# Patient Record
Sex: Male | Born: 1995
Health system: Southern US, Community
[De-identification: ages and names within clinical notes are randomized; demographics above are authoritative.]

## PROBLEM LIST (undated history)

## (undated) DIAGNOSIS — E785 Hyperlipidemia, unspecified: Secondary | ICD-10-CM

## (undated) DIAGNOSIS — R519 Headache, unspecified: Secondary | ICD-10-CM

## (undated) DIAGNOSIS — R51 Headache: Secondary | ICD-10-CM

## (undated) HISTORY — DX: Headache: R51

## (undated) HISTORY — PX: NO PAST SURGERIES: SHX2092

## (undated) HISTORY — DX: Headache, unspecified: R51.9

## (undated) HISTORY — DX: Hyperlipidemia, unspecified: E78.5

---
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
## 2018-02-05 ENCOUNTER — Ambulatory Visit: Payer: 59 | Admitting: Family Medicine

## 2018-02-05 ENCOUNTER — Encounter: Payer: Self-pay | Admitting: Family Medicine

## 2018-02-05 VITALS — BP 98/70 | HR 102 | Temp 98.6°F | Ht 70.75 in | Wt 227.0 lb

## 2018-02-05 DIAGNOSIS — Z0000 Encounter for general adult medical examination without abnormal findings: Secondary | ICD-10-CM

## 2018-02-05 DIAGNOSIS — Z23 Encounter for immunization: Secondary | ICD-10-CM

## 2018-02-05 DIAGNOSIS — G44219 Episodic tension-type headache, not intractable: Secondary | ICD-10-CM

## 2018-02-05 DIAGNOSIS — F329 Major depressive disorder, single episode, unspecified: Secondary | ICD-10-CM | POA: Diagnosis not present

## 2018-02-05 DIAGNOSIS — E6609 Other obesity due to excess calories: Secondary | ICD-10-CM | POA: Diagnosis not present

## 2018-02-05 DIAGNOSIS — R4589 Other symptoms and signs involving emotional state: Secondary | ICD-10-CM

## 2018-02-05 DIAGNOSIS — R21 Rash and other nonspecific skin eruption: Secondary | ICD-10-CM | POA: Diagnosis not present

## 2018-02-05 DIAGNOSIS — Z113 Encounter for screening for infections with a predominantly sexual mode of transmission: Secondary | ICD-10-CM

## 2018-02-05 LAB — COMPREHENSIVE METABOLIC PANEL WITH GFR
ALT: 51 U/L (ref 0–53)
AST: 24 U/L (ref 0–37)
Albumin: 4.9 g/dL (ref 3.5–5.2)
Alkaline Phosphatase: 41 U/L (ref 39–117)
BUN: 14 mg/dL (ref 6–23)
CO2: 28 meq/L (ref 19–32)
Calcium: 9.9 mg/dL (ref 8.4–10.5)
Chloride: 104 meq/L (ref 96–112)
Creatinine, Ser: 0.99 mg/dL (ref 0.40–1.50)
GFR: 101.46 mL/min
Glucose, Bld: 90 mg/dL (ref 70–99)
Potassium: 4.6 meq/L (ref 3.5–5.1)
Sodium: 140 meq/L (ref 135–145)
Total Bilirubin: 0.2 mg/dL (ref 0.2–1.2)
Total Protein: 8.3 g/dL (ref 6.0–8.3)

## 2018-02-05 LAB — CBC WITH DIFFERENTIAL/PLATELET
BASOS ABS: 0.1 10*3/uL (ref 0.0–0.1)
Basophils Relative: 0.8 % (ref 0.0–3.0)
Eosinophils Absolute: 0.1 10*3/uL (ref 0.0–0.7)
Eosinophils Relative: 1.2 % (ref 0.0–5.0)
HCT: 43.7 % (ref 39.0–52.0)
Hemoglobin: 14.9 g/dL (ref 13.0–17.0)
LYMPHS ABS: 2.4 10*3/uL (ref 0.7–4.0)
Lymphocytes Relative: 26.7 % (ref 12.0–46.0)
MCHC: 34.2 g/dL (ref 30.0–36.0)
MCV: 85.1 fl (ref 78.0–100.0)
MONO ABS: 0.5 10*3/uL (ref 0.1–1.0)
Monocytes Relative: 6 % (ref 3.0–12.0)
NEUTROS ABS: 5.9 10*3/uL (ref 1.4–7.7)
NEUTROS PCT: 65.3 % (ref 43.0–77.0)
PLATELETS: 310 10*3/uL (ref 150.0–400.0)
RBC: 5.14 Mil/uL (ref 4.22–5.81)
RDW: 13.3 % (ref 11.5–14.6)
WBC: 9 10*3/uL (ref 4.5–10.5)

## 2018-02-05 LAB — LIPID PANEL
Cholesterol: 266 mg/dL — ABNORMAL HIGH (ref 0–200)
HDL: 50.2 mg/dL
NonHDL: 215.79
Total CHOL/HDL Ratio: 5
Triglycerides: 374 mg/dL — ABNORMAL HIGH (ref 0.0–149.0)
VLDL: 74.8 mg/dL — ABNORMAL HIGH (ref 0.0–40.0)

## 2018-02-05 LAB — LDL CHOLESTEROL, DIRECT: LDL DIRECT: 174 mg/dL

## 2018-02-05 LAB — VITAMIN D 25 HYDROXY (VIT D DEFICIENCY, FRACTURES): VITD: 21.53 ng/mL — ABNORMAL LOW (ref 30.00–100.00)

## 2018-02-05 NOTE — Patient Instructions (Signed)

## 2018-02-05 NOTE — Progress Notes (Signed)
Subjective:    Patient ID: Nathan Bishop, male    DOB: 1996-04-17, 21 y.o.   MRN: [id number]  HPI This is a 22 yo male who presents today to establish care. He is accompanied by a friend who was not present for the entire interview. He has recently finished [employer] and will be going to [employer] for music education. He plays the double bass. Hangs out with friends.   No hospitalizations, no regular medications. His mother wanted him to have a check up.   Headaches- weekly, pain across front of head, pounding, light sensitivity, no sound sensitivity, no aura, no nausea/vomiting. Relief with ibuprofen, sleep. Caffeine- 3-4 servings daily. Seems to be triggered by inadequate sleep and stress.   Rash- has intermittent dryness of face, resolved with rare use of otc hydrocortisone cream.      Last CPE- 4 years ago Tdap/Td- due 8/19, will have today Flu- never Dental- regular Exercise- walking Diet- working on eating healthier Sex- sexually active with one male partner; she has had additional partners, he has never had STD testing, will have today    Past Medical History:  Diagnosis Date  . Frequent headaches   . Hyperlipidemia   History reviewed. No pertinent surgical history. Family History  Problem Relation Age of Onset  . Arthritis Mother   . Cancer Mother   . Hyperlipidemia Mother   . Misc[place]riages [place]   . Arthritis Father   . Heart disease Father   . Hyperlipidemia Father   . Arthritis Maternal Grandmother   . Arthritis Maternal Grandfather   . Hypertension Maternal Grandfather   . Heart disease Maternal Grandfather   . Hyperlipidemia Maternal Grandfather   . Hypertension Paternal Grandfather   . Heart attack Paternal Grandfather    Social History   Tobacco Use  . Smoking status: Never Smoker  . Smokeless tobacco: Never Used  Substance Use Topics  . Alcohol use: Not Currently  . Drug use: Never       Review of Systems  Constitutional:  Positive for fatigue. Negative for fever and unexpected weight change.  HENT: Negative.   Eyes: Negative.   Respiratory: Negative.   Cardiovascular: Negative.   Endocrine: Negative.   Genitourinary: Negative.   Musculoskeletal: Negative.   Skin:       Dry areas on face.   Allergic/Immunologic: Negative.   Neurological: Negative.   Hematological: Negative.   Psychiatric/Behavioral: Positive for dysphoric mood, sleep disturbance (irregular sleep) and suicidal ideas (not recent, never attempted, no plan, no firearms in home). Negative for self-injury.       Objective:   Physical Exam Physical Exam  Constitutional: He is oriented to person, place, and time. He appears well-developed and well-nourished.  HENT:  Head: Normocephalic and atraumatic.  Right Ear: External ear normal.  Left Ear: External ear normal.  Nose: Nose normal.  Mouth/Throat: Oropharynx is clear and moist.  Eyes: Conjunctivae are normal. Pupils are equal, round, and reactive to light.  Neck: Normal range of motion. Neck supple.  Cardiovascular: Normal rate, regular rhythm, normal heart sounds and intact distal pulses.   Pulmonary/Chest: Effort normal and breath sounds normal.  Abdominal: Soft. Bowel sounds are normal. Hernia confirmed negative in the right inguinal area and confirmed negative in the left inguinal area.  Genitourinary: Testes normal and penis normal. Circumcised.  Musculoskeletal: Normal range of motion. He exhibits no edema or tenderness.       Cervical back: Normal.       Thoracic  back: Normal.       Lumbar back: Normal.  Lymphadenopathy:    He has no cervical adenopathy.       Right: No inguinal adenopathy present.       Left: No inguinal adenopathy present.  Neurological: He is alert and oriented to person, place, and time. He has normal reflexes.  Skin: Skin is warm and dry.  Psychiatric: He has a normal mood and affect. His behavior is normal. Judgment normal.  Vitals  reviewed.     BP 98/70   Pulse (!) 102   Temp 98.6 F (37 C)   Wt 227 lb (103 kg)   SpO2 98%   Depression screen PHQ 2/9 02/05/2018  Decreased Interest 2  Down, Depressed, Hopeless 2  PHQ - 2 Score 4  Altered sleeping 2  Tired, decreased energy 2  Change in appetite 1  Feeling bad or failure about yourself  2  Trouble concentrating 1  Moving slowly or fidgety/restless 1  Suicidal thoughts 1  PHQ-9 Score 14       Assessment & Plan:  1. Annual physical exam - will request records from previous pcp  2. Routine screening for STI (sexually transmitted infection) - RPR - HIV antibody - C. TRACHOMATIS/N. GONORRHOEAE RNA ([facility])  3. Rash of face - discussed limited use of otc steroid cream, daily sunscreen - Comprehensive metabolic panel - Vitamin D, 25-hydroxy - CBC with Differential  4. Episodic tension-type headache, not intractable - seems triggered by fatigue, stress, discussed sleep hygiene, hydration, stress management  5. Obesity due to excess calories without serious comorbidity, unspecified classification - encouraged his efforts to make healthy food choices, exercise regularly - Lipid Panel - Comprehensive metabolic panel - Vitamin D, 25-hydroxy  6. Need for tetanus booster - Td : Tetanus/diphtheria >7yo Preservative  free  7. Depressed mood - discussed therapy, medication, lifestyle modifications to improve sleep and mood - discussed available resources, RTC/ER precautions  - RTC 1 year, sooner if needed  Olean Ree, FNP-BC  [facility], [facility]  02/11/2018 12:08 PM

## 2018-02-08 LAB — C. TRACHOMATIS/N. GONORRHOEAE RNA
C. TRACHOMATIS RNA, TMA: NOT DETECTED
N. gonorrhoeae RNA, TMA: NOT DETECTED

## 2018-02-08 LAB — HIV ANTIBODY (ROUTINE TESTING W REFLEX): HIV 1&2 Ab, 4th Generation: NONREACTIVE

## 2018-02-08 LAB — RPR: RPR Ser Ql: NONREACTIVE

## 2018-02-09 ENCOUNTER — Telehealth: Payer: Self-pay | Admitting: Family Medicine

## 2018-02-09 NOTE — Telephone Encounter (Signed)
Copied from CRM [id number]. Topic: Quick Communication - Lab Results >> Feb 09, 2018 10:04 AM Lutricia Horsfallondon, [place] A, [place] wrote: Called patient to inform them of  lab results. When patient returns call, triage nurse may disclose results. >> Feb 09, 2018 12:41 PM Gaynelle AduPoole, Shalonda wrote: Pt is requesting a call back after 2pm

## 2018-02-10 NOTE — Telephone Encounter (Signed)
Left message on voicemail for pt to return call to office for results.    

## 2018-02-11 ENCOUNTER — Encounter: Payer: Self-pay | Admitting: Family Medicine

## 2018-08-04 ENCOUNTER — Encounter: Payer: Self-pay | Admitting: Family Medicine

## 2018-08-04 ENCOUNTER — Ambulatory Visit: Payer: 59 | Admitting: Family Medicine

## 2018-08-04 VITALS — BP 138/98 | HR 82 | Temp 98.3°F | Ht 70.75 in | Wt 237.0 lb

## 2018-08-04 DIAGNOSIS — F329 Major depressive disorder, single episode, unspecified: Secondary | ICD-10-CM

## 2018-08-04 DIAGNOSIS — F419 Anxiety disorder, unspecified: Secondary | ICD-10-CM | POA: Diagnosis not present

## 2018-08-04 DIAGNOSIS — B070 Plantar wart: Secondary | ICD-10-CM

## 2018-08-04 DIAGNOSIS — F32A Depression, unspecified: Secondary | ICD-10-CM

## 2018-08-04 MED ORDER — SERTRALINE HCL 50 MG PO TABS: 50.0000 mg | ORAL_TABLET | Freq: Every day | ORAL | 3 refills | Status: DC

## 2018-08-04 NOTE — Patient Instructions (Addendum)
Good to see you today  Please follow up in 6-8 weeks to see how you are doing on your medication. Follow up sooner if worsening symptoms or medication side effects.   Things that help in addition to medication- balanced diet, regular exercise, regular sleep (not too much or too little), avoid alcohol, mindfulness/meditation/yoga  For plantar wart (see information included)- gently remove top of callous and apply over the counter salicylic wart remover, cover with duct tape or band aid, reapply when tape falls off, no more than once a day  Mindfulness-Based Stress Reduction Mindfulness-based stress reduction (MBSR) is a program that helps people learn to practice mindfulness. Mindfulness is the practice of intentionally paying attention to the present moment. It can be learned and practiced through techniques such as education, breathing exercises, meditation, and yoga. MBSR includes several mindfulness techniques in one program. MBSR works best when you understand the treatment, are willing to try new things, and can commit to spending time practicing what you learn. MBSR training may include learning about:  How your emotions, thoughts, and reactions affect your body.  New ways to respond to things that cause negative thoughts to start (triggers).  How to notice your thoughts and let go of them.  Practicing awareness of everyday things that you normally do without thinking.  The techniques and goals of different types of meditation.  What are the benefits of MBSR? MBSR can have many benefits, which include helping you to:  Develop self-awareness. This refers to knowing and understanding yourself.  Learn skills and attitudes that help you to participate in your own health care.  Learn new ways to care for yourself.  Be more accepting about how things are, and let things go.  Be less judgmental and approach things with an open mind.  Be patient with yourself and trust yourself  more.  MBSR has also been shown to:  Reduce negative emotions, such as depression and anxiety.  Improve memory and focus.  Change how you sense and approach pain.  Boost your body's ability to fight infections.  Help you connect better with other people.  Improve your sense of well-being.  Follow these instructions at home:  Find a local in-person or online MBSR program.  Set aside some time regularly for mindfulness practice.  Find a mindfulness practice that works best for you. This may include one or more of the following: ? Meditation. Meditation involves focusing your mind on a certain thought or activity. ? Breathing awareness exercises. These help you to stay present by focusing on your breath. ? Body scan. For this practice, you lie down and pay attention to each part of your body from head to toe. You can identify tension and soreness and intentionally relax parts of your body. ? Yoga. Yoga involves stretching and breathing, and it can improve your ability to move and be flexible. It can also provide an experience of testing your body's limits, which can help you release stress. ? Mindful eating. This way of eating involves focusing on the taste, texture, color, and smell of each bite of food. Because this slows down eating and helps you feel full sooner, it can be an important part of a weight-loss plan.  Find a podcast or recording that provides guidance for breathing awareness, body scan, or meditation exercises. You can listen to these any time when you have a free moment to rest without distractions.  Follow your treatment plan as told by your health care provider. This may include  taking regular medicines and making changes to your diet or lifestyle as recommended. How to practice mindfulness To do a basic awareness exercise:  Find a comfortable place to sit.  Pay attention to the present moment. Observe your thoughts, feelings, and surroundings just as they  are.  Avoid placing judgment on yourself, your feelings, or your surroundings. Make note of any judgment that comes up, and let it go.  Your mind may wander, and that is okay. Make note of when your thoughts drift, and return your attention to the present moment.  To do basic mindfulness meditation:  Find a comfortable place to sit. This may include a stable chair or a firm floor cushion. ? Sit upright with your back straight. Let your arms fall next to your side with your hands resting on your legs. ? If sitting in a chair, rest your feet flat on the floor. ? If sitting on a cushion, cross your legs in front of you.  Keep your head in a neutral position with your chin dropped slightly. Relax your jaw and rest the tip of your tongue on the roof of your mouth. Drop your gaze to the floor. You can close your eyes if you like.  Breathe normally and pay attention to your breath. Feel the air moving in and out of your nose. Feel your belly expanding and relaxing with each breath.  Your mind may wander, and that is okay. Make note of when your thoughts drift, and return your attention to your breath.  Avoid placing judgment on yourself, your feelings, or your surroundings. Make note of any judgment or feelings that come up, let them go, and bring your attention back to your breath.  When you are ready, lift your gaze or open your eyes. Pay attention to how your body feels after the meditation.  Where to find more information: You can find more information about MBSR from:  Your health care provider.  Community-based meditation centers or programs.  Programs offered near you.  Summary  Mindfulness-based stress reduction (MBSR) is a program that teaches you how to intentionally pay attention to the present moment. It is used with other treatments to help you cope better with daily stress, emotions, and pain.  MBSR focuses on developing self-awareness, which allows you to respond to life  stress without judgment or negative emotions.  MBSR programs may involve learning different mindfulness practices, such as breathing exercises, meditation, yoga, body scan, or mindful eating. Find a mindfulness practice that works best for you, and set aside time for it on a regular basis. This information is not intended to replace advice given to you by your health care provider. Make sure you discuss any questions you have with your health care provider. Document Released: 12/18/2016 Document Revised: 12/18/2016 Document Reviewed: 12/18/2016 Elsevier Interactive Patient Education  2018 Elsevier Inc.  Plantar Warts Plantar warts are small growths on the bottom of the foot (sole). Warts are caused by a type of germ (virus). Most warts are not painful, and they usually do not cause problems. Sometimes, plantar warts can cause pain when you walk. Warts often go away on their own in time. Treatments may be done if needed. Follow these instructions at home: General instructions  Apply creams or solutions only as told by your doctor. Follow these steps if your doctor tells you to do so: ? Soak your foot in warm water. ? Remove the top layer of softened skin before you apply the medicine. You can  use a pumice stone to remove the tissue. ? After you apply the medicine, put a bandage over the area of the wart. ? Repeat the process every day or as told by your doctor.  Do not scratch or pick at a wart.  Wash your hands after you touch a wart.  If a wart is painful, try putting a bandage with a hole in the middle over the wart.  Keep all follow-up visits as told by your doctor. This is important. Prevention  Wear shoes and socks. Change socks every day.  Keep your feet clean and dry.  Check your feet often.  Avoid direct contact with warts on other people. Contact a doctor if:  Your warts do not improve after treatment.  You have redness, swelling, or pain at the site of a wart.  You  have bleeding from a wart, and the bleeding does not stop when you put light pressure on the wart.  You have diabetes and you get a wart. This information is not intended to replace advice given to you by your health care provider. Make sure you discuss any questions you have with your health care provider. Document Released: 09/13/2010 Document Revised: 01/17/2016 Document Reviewed: 11/06/2014 Elsevier Interactive Patient Education  [employer]2018 Elsevier Inc.

## 2018-08-04 NOTE — Progress Notes (Signed)
Subjective:    Patient ID: Nathan Bishop, male    DOB: [birth year], 22 y.o.   MRN: [id number]  HPI This is a 22 yo male who presents today with increasing depression and anxiety. This has been an on and off problem for at least 2 years. Got worse over last 2 months, increased overthinking, panic attacks. Has been seeing a counselor for about a year. He is going to take the next semester off. Will be working and playing more music. Is getting a psychological withdrawal so his grades won't be affected.  Concerned for OCD- repetative checking for locked doors, doesn't like radio station number to be divisible by 5. Some difficulty going to sleep, if he awakens, has difficulty going back to sleep. Sleeping is off schedule, sometimes sleeps too much, other times 5 hours.  Doing some hiking. Occasional panic attacks, hyperventilate, chest pressure, sweating. Occurring less frequently since out of school, max 4-5 times a week. Will be seeing counselor weekly since he is home. Several weeks ago had thoughts of suicide, no plan, never has had a plan or attempted suicide. No recent thoughts of suicide. Living with his parents. Firearms in home, they are locked up.  Mother has anxiety, does not believe in medication but thinks he should try.  Migraines stable. Relieved with ibuprofen.  Has had callous on bottom of right foot for several months. Has not tried any treatment.     Past Medical History:  Diagnosis Date  . Frequent headaches   . Hyperlipidemia    No past surgical history on file. Family History  Problem Relation Age of Onset  . Arthritis Mother   . Cancer Mother   . Hyperlipidemia Mother   . Miscarriages / [place] Mother   . Arthritis Father   . Heart disease Father   . Hyperlipidemia Father   . Arthritis Maternal Grandmother   . Arthritis Maternal Grandfather   . Hypertension Maternal Grandfather   . Heart disease Maternal Grandfather   . Hyperlipidemia Maternal Grandfather   .  Hypertension Paternal Grandfather   . Heart attack Paternal Grandfather    Social History   Tobacco Use  . Smoking status: Never Smoker  . Smokeless tobacco: Never Used  Substance Use Topics  . Alcohol use: Not Currently  . Drug use: Never      Review of Systems Per HPI    Objective:   Physical Exam Physical Exam  Constitutional: Oriented to person, place, and time. He appears well-developed and well-nourished.  HENT:  Head: Normocephalic and atraumatic.  Eyes: Conjunctivae are normal.  Neck: Normal range of motion. Neck supple.  Cardiovascular: Normal rate, regular rhythm and normal heart sounds.   Pulmonary/Chest: Effort normal and breath sounds normal.  Musculoskeletal: Normal range of motion.  Neurological: Alert and oriented to person, place, and time.  Skin: Skin is warm and dry. Bottom of left foot with 1.5 cm area callous, TTP, few black dots.  Psychiatric: Normal mood and affect. Behavior is normal. Judgment and thought content normal.  Vitals reviewed.     BP (!) 138/98   Pulse 82   Temp 98.3 F (36.8 C) (Oral)   Ht 5' 10.75" (1.797 m)   Wt 237 lb (107.5 kg)   SpO2 98%   BMI 33.29 kg/m  Wt Readings from Last 3 Encounters:  08/04/18 237 lb (107.5 kg)  02/05/18 227 lb (103 kg)       Assessment & Plan:  1. Anxiety and depression - continue counseling -  discussed medication including potential for side effects and increased suicide risk in young adults. He is to seek immediate help if any thoughts of suicide.  - discussed importance of exercise, regular sleep, healthy food choices - follow up in 6-8 weeks, sooner if medication side effects or worsening sympotoms - sertraline (ZOLOFT) 50 MG tablet; Take 1 tablet (50 mg total) by mouth at bedtime.  Dispense: 30 tablet; Refill: 3  2. Plantar wart - Provided written and verbal information regarding diagnosis and treatment. - will recheck at follow up, if no improvement, refer to podiatry  Over 30  minutes were spent face-to-face with the patient during this encounter and >50% of that time was spent on counseling and coordination of care    Olean Reeeborah Jahmeir Geisen, FNP-BC  [facility], [facility]  08/04/2018 12:32 PM

## 2018-09-07 ENCOUNTER — Ambulatory Visit: Payer: 59 | Admitting: Family Medicine

## 2018-09-07 ENCOUNTER — Encounter: Payer: Self-pay | Admitting: Family Medicine

## 2018-09-07 VITALS — BP 118/86 | HR 86 | Temp 97.7°F | Ht 70.75 in | Wt 234.8 lb

## 2018-09-07 DIAGNOSIS — J00 Acute nasopharyngitis [common cold]: Secondary | ICD-10-CM

## 2018-09-07 NOTE — Progress Notes (Signed)
   Subjective:     Nathan Bishop is a 23 y.o. male presenting for URI (Symptoms have been off and on since November.  Sinus pressure, nasal congestion, ears feel full, sometimes productive cough, drainage, a little bit of sore throat, some headaches. No fever. Has been taking Advil cold and sinus.)     URI   This is a new problem. The current episode started in the past 7 days. The problem has been gradually worsening. There has been no fever. Associated symptoms include congestion, coughing, headaches, a plugged ear sensation, rhinorrhea and sinus pain. Pertinent negatives include no diarrhea, nausea, sore throat, swollen glands or vomiting. He has tried decongestant and NSAIDs for the symptoms. The treatment provided mild relief.     Review of Systems  HENT: Positive for congestion, rhinorrhea and sinus pain. Negative for sore throat.   Respiratory: Positive for cough.   Gastrointestinal: Negative for diarrhea, nausea and vomiting.  Neurological: Positive for headaches.     Social History   Tobacco Use  Smoking Status Never Smoker  Smokeless Tobacco Never Used        Objective:    BP Readings from Last 3 Encounters:  09/07/18 118/86  08/04/18 (!) 138/98  02/05/18 98/70   Wt Readings from Last 3 Encounters:  09/07/18 234 lb 12 oz (106.5 kg)  08/04/18 237 lb (107.5 kg)  02/05/18 227 lb (103 kg)    BP 118/86   Pulse 86   Temp 97.7 F (36.5 C)   Ht 5' 10.75" (1.797 m)   Wt 234 lb 12 oz (106.5 kg)   SpO2 98%   BMI 32.97 kg/m    Physical Exam Constitutional:      General: He is not in acute distress.    Appearance: He is well-developed. He is not ill-appearing.  HENT:     Head: Normocephalic and atraumatic.     Right Ear: Tympanic membrane and ear canal normal.     Left Ear: Tympanic membrane and ear canal normal.     Nose: Mucosal edema and rhinorrhea present.     Right Sinus: No maxillary sinus tenderness or frontal sinus tenderness.     Left Sinus: No  maxillary sinus tenderness or frontal sinus tenderness.     Mouth/Throat:     Pharynx: Uvula midline. Posterior oropharyngeal erythema present. No oropharyngeal exudate.     Tonsils: Swelling: 0 on the right. 0 on the left.  Neck:     Musculoskeletal: Neck supple.  Cardiovascular:     Rate and Rhythm: Normal rate and regular rhythm.     Heart sounds: No murmur.  Pulmonary:     Effort: Pulmonary effort is normal. No respiratory distress.     Breath sounds: Normal breath sounds.  Lymphadenopathy:     Cervical: No cervical adenopathy.  Skin:    General: Skin is warm and dry.     Capillary Refill: Capillary refill takes less than 2 seconds.  Neurological:     Mental Status: He is alert.           Assessment & Plan:   Problem List Items Addressed This Visit    None    Visit Diagnoses    Acute nasopharyngitis    -  Primary     Symptomatic care  Call back if not improved and still with facial pressure in 1 week and will do round of Abx.   Return if symptoms worsen or fail to improve.  Lynnda ChildJessica R Cody, MD

## 2018-09-07 NOTE — Patient Instructions (Addendum)
Based on your symptoms, it looks like you have a virus.   Antibiotics are not need for a viral infection but the following will help:   1. Drink plenty of fluids 2. Get lots of rest  Sinus Congestion 1) Neti Pot (Saline rinse) -- 2 times day -- if tolerated 2) Flonase (Store Brand ok) - once daily 3) Over the counter congestion medications - Pseudoephedrine   Cough 1) Cough drops can be helpful 2) Nyquil (or nighttime cough medication) 3) Honey is proven to be one of the best cough medications   Sore Throat 1) Honey as above, cough drops 2) Ibuprofen or Aleve can be helpful 3) Salt water Gargles  If you develop fevers (Temperature >100.4), chills, worsening symptoms or symptoms lasting longer than 10 days return to clinic.  --- Call the clinic if still with severe sinus pressure and pain and I will send in Antibiotics

## 2018-10-22 ENCOUNTER — Encounter: Payer: Self-pay | Admitting: Family Medicine

## 2018-10-22 ENCOUNTER — Ambulatory Visit: Payer: 59 | Admitting: Family Medicine

## 2018-10-22 VITALS — BP 124/78 | HR 90 | Temp 97.7°F | Resp 16 | Ht 70.75 in | Wt 235.4 lb

## 2018-10-22 DIAGNOSIS — F419 Anxiety disorder, unspecified: Secondary | ICD-10-CM | POA: Diagnosis not present

## 2018-10-22 DIAGNOSIS — B070 Plantar wart: Secondary | ICD-10-CM | POA: Diagnosis not present

## 2018-10-22 DIAGNOSIS — F329 Major depressive disorder, single episode, unspecified: Secondary | ICD-10-CM | POA: Diagnosis not present

## 2018-10-22 NOTE — Progress Notes (Signed)
Subjective:    Patient ID: Nathan Bishop, male    DOB: 1995-10-06, 23 y.o.   MRN: [id number]  HPI This is a 23 yo male who presents today for follow up of anxiety/depression. Was seen 08/04/18 and started on sertraline. He denies any side effects. He reports symptoms have improved significantly. He continues to go to counseling. He wishes to stay at current dose.  His girlfriend is supportive. He is taking off a semester and has been working part time and playing more music.   Plantar wart- he used otc treatments- liquid and freezing with no relief, has used pumice stone to decrease callous. Painful. He is on his feet a lot and it bothers him.   Past Medical History:  Diagnosis Date  . Frequent headaches   . Hyperlipidemia    No past surgical history on file. Family History  Problem Relation Age of Onset  . Arthritis Mother   . Cancer Mother   . Hyperlipidemia Mother   . Miscarriages / [place] Mother   . Arthritis Father   . Heart disease Father   . Hyperlipidemia Father   . Arthritis Maternal Grandmother   . Arthritis Maternal Grandfather   . Hypertension Maternal Grandfather   . Heart disease Maternal Grandfather   . Hyperlipidemia Maternal Grandfather   . Hypertension Paternal Grandfather   . Heart attack Paternal Grandfather    Social History   Tobacco Use  . Smoking status: Never Smoker  . Smokeless tobacco: Never Used  Substance Use Topics  . Alcohol use: Not Currently  . Drug use: Never      Review of Systems Per HPI    Objective:   Physical Exam Vitals signs reviewed.  Constitutional:      General: He is not in acute distress.    Appearance: Normal appearance. He is obese. He is not ill-appearing, toxic-appearing or diaphoretic.  HENT:     Head: Normocephalic and atraumatic.  Eyes:     Conjunctiva/sclera: Conjunctivae normal.  Cardiovascular:     Rate and Rhythm: Normal rate.  Pulmonary:     Effort: Pulmonary effort is normal.  Skin:  General: Skin is warm and dry.     Comments: Sole of left foot with 1 cm planar wart.  Verbal consent obtained.  Cleansed skin with alcohol prep pad.  Cryotherapy applied (freeze-thaw-freeze).  The patient tolerated the procedure well.   Neurological:     Mental Status: He is alert and oriented to person, place, and time.  Psychiatric:        Mood and Affect: Mood normal.        Behavior: Behavior normal.        Thought Content: Thought content normal.        Judgment: Judgment normal.       BP 124/78 (BP Location: Left Arm, Patient Position: Sitting, Cuff Size: Normal)   Pulse 90   Temp 97.7 F (36.5 C) (Oral)   Resp 16   Ht 5' 10.75" (1.797 m)   Wt 235 lb 6.4 oz (106.8 kg)   SpO2 98%   BMI 33.06 kg/m  Wt Readings from Last 3 Encounters:  10/22/18 235 lb 6.4 oz (106.8 kg)  09/07/18 234 lb 12 oz (106.5 kg)  08/04/18 237 lb (107.5 kg)   Depression screen [facility] 2/9 10/22/2018 08/04/2018 02/05/2018  Decreased Interest 0 2 2  Down, Depressed, Hopeless 1 3 2   PHQ - 2 Score 1 5 4   Altered sleeping 1 2 2   Tired,  decreased energy 1 3 2   Change in appetite 1 2 1   Feeling bad or failure about yourself  1 3 2   Trouble concentrating 1 1 1   Moving slowly or fidgety/restless 1 0 1  Suicidal thoughts 0 1 1  PHQ-9 Score 7 17 14   Difficult doing work/chores Somewhat difficult Very difficult -   GAD 7 : Generalized Anxiety Score 10/22/2018 08/04/2018  Nervous, Anxious, on Edge 1 2  Control/stop worrying 0 2  Worry too much - different things 1 3  Trouble relaxing 0 1  Restless 0 1  Easily annoyed or irritable 0 2  Afraid - awful might happen 1 2  Total GAD 7 Score 3 13  Anxiety Difficulty Somewhat difficult -        Assessment & Plan:  1. Anxiety and depression - significantly improved, continue current dose of sertraline, counseling, meditation/deep breathing, encouraged regular exercise  - follow up in 4 months, sooner if symptoms worsend  2. Plantar wart - Provided  written and verbal information regarding diagnosis and treatment. - Treated today with liquid nitrogen, reviewed RTC precautions, s/s infection.    Olean Ree, FNP-BC  [place] [facility] at [facility], [facility]  10/22/2018 1:35 PM

## 2018-10-22 NOTE — Patient Instructions (Signed)
Good to see you today  Follow up in 4 months  Keep foot clean and dry, can try wart tape or duct tape   Plantar Warts Warts are small growths on the skin. When they occur on the underside (sole) of the foot, they are called plantar warts. Plantar warts often occur in groups, with several small warts around a larger wart. They tend to develop on the heel or the ball of the foot. They may grow into the deeper layers of skin or rise above the surface of the skin. Most warts are not painful, and they usually do not cause problems. However, plantar warts may cause pain when you walk because pressure is applied to them. Plantar warts may spread to other areas of the sole. They can also spread to other areas of the body through direct and indirect contact. Warts often go away on their own in time. Various treatments may be done if needed or desired. What are the causes? Plantar warts are caused by a type of virus that is called human papillomavirus (HPV).  Walking barefoot can cause exposure to the virus, especially if your feet are wet.  HPV attacks a break in the skin of the foot. What increases the risk? You are more likely to develop this condition if you:  Are between 44-68 years of age.  Use public showers or locker rooms.  Have a weakened body defense system (immune system). What are the signs or symptoms? Common symptoms of this condition include:  Flat or slightly raised growths that have a rough surface and look similar to a callus.  Pain when you use your foot to support your body weight. How is this diagnosed? A plantar wart can usually be diagnosed from its appearance. In some cases, a tissue sample may be removed (biopsy) to be looked at under a microscope. How is this treated? In many cases, warts do not need treatment. Without treatment, they often go away with time. If treatment is needed or desired, options may include:  Applying medicated solutions, creams, or patches  to the wart. These may be over-the-counter or prescription medicines that make the skin soft so that layers will gradually shed away. In many cases, the medicine is applied one or two times a day and covered with a bandage.  Freezing the wart with liquid nitrogen (cryotherapy).  Burning the wart with: ? Laser treatment. ? An electrified probe (electrocautery).  Injecting a medicine (Candida antigen) into the wart to help the body's immune system fight off the wart.  Having surgery to remove the wart.  Putting duct tape over the top of the wart (occlusion). You will leave the tape in place for as long as told by your health care provider, and then you will replace it with a new strip of tape. This is done until the wart goes away. Repeat treatment may be needed if you choose to remove warts. Warts sometimes go away and come back again. Follow these instructions at home:  Apply medicated creams or solutions only as told by your health care provider. This may involve: ? Soaking the affected area in warm water. ? Removing the top layer of softened skin before you apply the medicine. A pumice stone works well for removing the skin. ? Applying a bandage over the affected area after you apply the medicine. ? Repeating the process daily or as told by your health care provider.  Do not scratch or pick at a wart.  Wash your hands  after you touch a wart.  If a wart is painful, try covering it with a bandage that has a hole in the middle. This helps to take pressure off the wart.  Keep all follow-up visits as told by your health care provider. This is important. How is this prevented? Take these actions to help prevent warts:  Wear shoes and socks. Change your socks daily.  Keep your feet clean and dry.  Do not walk barefoot in shared locker rooms, shower areas, or swimming pools.  Check your feet regularly.  Avoid direct contact with warts on other people. Contact a health care provider  if:  Your warts do not improve after treatment.  You have redness, swelling, or pain at the site of a wart.  You have bleeding from a wart that does not stop with light pressure.  You have diabetes and you develop a wart. Summary  Warts are small growths on the skin. When they occur on the underside (sole) of the foot, they are called plantar warts.  In many cases, warts do not need treatment. Without treatment, they often go away with time.  Apply medicated creams or solutions only as told by your health care provider.  Do not scratch or pick at a wart. Wash your hands after you touch a wart.  Keep all follow-up visits as told by your health care provider. This is important. This information is not intended to replace advice given to you by your health care provider. Make sure you discuss any questions you have with your health care provider. Document Released: 11/01/2003 Document Revised: 03/09/2018 Document Reviewed: 03/09/2018 Elsevier Interactive Patient Education  2019 [employer].

## 2018-11-17 ENCOUNTER — Other Ambulatory Visit: Payer: Self-pay

## 2018-11-17 ENCOUNTER — Telehealth: Payer: Self-pay | Admitting: Family Medicine

## 2018-11-17 DIAGNOSIS — F329 Major depressive disorder, single episode, unspecified: Secondary | ICD-10-CM

## 2018-11-17 DIAGNOSIS — F419 Anxiety disorder, unspecified: Principal | ICD-10-CM

## 2018-11-17 MED ORDER — SERTRALINE HCL 50 MG PO TABS: 50.0000 mg | ORAL_TABLET | Freq: Every day | ORAL | 3 refills | Status: DC

## 2018-11-17 NOTE — Telephone Encounter (Signed)
[facility] per pt request

## 2018-11-17 NOTE — Telephone Encounter (Signed)
Pt dropped off form to be filled out to have his Zoloft sent through the mail. Placed in RX tower.

## 2019-09-12 ENCOUNTER — Other Ambulatory Visit: Payer: Self-pay

## 2019-09-12 ENCOUNTER — Encounter: Payer: Self-pay | Admitting: Family Medicine

## 2019-09-12 ENCOUNTER — Ambulatory Visit: Payer: 59 | Admitting: Family Medicine

## 2019-09-12 ENCOUNTER — Ambulatory Visit (INDEPENDENT_AMBULATORY_CARE_PROVIDER_SITE_OTHER)
Admission: RE | Admit: 2019-09-12 | Discharge: 2019-09-12 | Disposition: A | Discharge status: Home / Self Care | Payer: 59 | Source: Ambulatory Visit | Attending: Family Medicine | Admitting: Family Medicine

## 2019-09-12 VITALS — BP 130/100 | HR 92 | Temp 98.0°F | Ht 70.75 in | Wt 248.0 lb

## 2019-09-12 DIAGNOSIS — L719 Rosacea, unspecified: Secondary | ICD-10-CM

## 2019-09-12 DIAGNOSIS — E559 Vitamin D deficiency, unspecified: Secondary | ICD-10-CM | POA: Diagnosis not present

## 2019-09-12 DIAGNOSIS — M79674 Pain in right toe(s): Secondary | ICD-10-CM

## 2019-09-12 MED ORDER — METRONIDAZOLE 0.75 % EX LOTN: 1.0000 "application " | TOPICAL_LOTION | Freq: Every day | CUTANEOUS | 1 refills | Status: DC | PRN

## 2019-09-12 NOTE — Progress Notes (Signed)
Subjective:    Patient ID: Nathan Bishop, male    DOB: [birth year], 24 y.o.   MRN: [id number]  HPI Chief Complaint  Patient presents with  . Toe Pain    R great    Patient with 5 days of right toe pain. Sudden onset, no known injury. Pain got better initially but is now staying the same. Took some ibuprofen 600-800 one dose a day and uses ice. Hard to know if ibuprofen helped, took at bedtime, no interruption of sleep.  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  Depression and anxiety improved since since he is out of school.  Dry skin and redness across nose.  Comes and goes.  Some flaky scalp.   Past Medical History:  Diagnosis Date  . Frequent headaches   . Hyperlipidemia    No past surgical history on file. Family History  Problem Relation Age of Onset  . Arthritis Mother   . Cancer Mother   . Hyperlipidemia Mother   . Miscarriages / [place] Mother   . Arthritis Father   . Heart disease Father   . Hyperlipidemia Father   . Arthritis Maternal Grandmother   . Arthritis Maternal Grandfather   . Hypertension Maternal Grandfather   . Heart disease Maternal Grandfather   . Hyperlipidemia Maternal Grandfather   . Hypertension Paternal Grandfather   . Heart attack Paternal Grandfather    Social History   Tobacco Use  . Smoking status: Never Smoker  . Smokeless tobacco: Never Used  Substance Use Topics  . Alcohol use: Not Currently  . Drug use: Never      Review of Systems Per HPI    Objective:   Physical Exam Vitals reviewed.  Constitutional:      Appearance: Normal appearance. He is obese.  HENT:     Head: Normocephalic and atraumatic.  Cardiovascular:     Rate and Rhythm: Normal rate.     Pulses: Normal pulses.  Pulmonary:     Effort: Pulmonary effort is normal.  Musculoskeletal:     Right foot: Swelling and tenderness present.     Comments: Pain, swelling, erythema right great toe.  Normal temperature, no streaking  Skin:    General: Skin is  warm and dry.     Findings: Rash (erythemaous, papular rash across bridge of nose/ cheeks) present.  Neurological:     Mental Status: He is alert and oriented to person, place, and time.  Psychiatric:        Mood and Affect: Mood normal.        Behavior: Behavior normal.        Thought Content: Thought content normal.        Judgment: Judgment normal.       Blood pressure (!) 130/100, pulse 92, temperature 98 F (36.7 C), temperature source Skin, height 5' 10.75" (1.797 m), weight 248 lb (112.5 kg), SpO2 97 %.  Wt Readings from Last 3 Encounters:  09/12/19 248 lb (112.5 kg)  10/22/18 235 lb 6.4 oz (106.8 kg)  09/07/18 234 lb 12 oz (106.5 kg)   BP Readings from Last 3 Encounters:  09/12/19 (!) 130/100  10/22/18 124/78  09/07/18 118/86       Assessment & Plan:  1. Great toe pain, right -We will check labs and x-ray given sudden onset.  No known triggers.  Discussed using over-the-counter ibuprofen every 8-12 hours for pain and swelling. - Uric Acid - DG Toe Great Right; Future  2. Vitamin D deficiency -  Vitamin D, 25-hydroxy  3. Rosacea - METRONIDAZOLE, TOPICAL, 0.75 % LOTN; Apply 1 application topically daily as needed.  Dispense: 59 mL; Refill: 1  - follow up in 3-4 months for CPE  This visit occurred during the SARS-CoV-2 public health emergency.  Safety protocols were in place, including screening questions prior to the visit, additional usage of staff PPE, and extensive cleaning of exam room while observing appropriate contact time as indicated for disinfecting solutions.    Clarene Reamer, FNP-BC  [place] [facility] at [facility], [facility]  09/14/2019 10:20 AM

## 2019-09-12 NOTE — Patient Instructions (Signed)
Good to see you today  Can take ibuprofen 2-3 tablets every 8 to 12 hours  Elevate, when sitting, try heat/ ice

## 2019-09-13 LAB — VITAMIN D 25 HYDROXY (VIT D DEFICIENCY, FRACTURES): VITD: 25.48 ng/mL — ABNORMAL LOW (ref 30.00–100.00)

## 2019-09-13 LAB — URIC ACID: Uric Acid, Serum: 11.4 mg/dL — ABNORMAL HIGH (ref 4.0–7.8)

## 2019-09-14 ENCOUNTER — Encounter: Payer: Self-pay | Admitting: Family Medicine

## 2019-09-16 ENCOUNTER — Other Ambulatory Visit: Payer: Self-pay | Admitting: Family Medicine

## 2019-09-16 ENCOUNTER — Encounter: Payer: Self-pay | Admitting: Family Medicine

## 2019-09-16 DIAGNOSIS — M79674 Pain in right toe(s): Secondary | ICD-10-CM

## 2019-09-16 MED ORDER — NAPROXEN 500 MG PO TABS: 500.0000 mg | ORAL_TABLET | Freq: Two times a day (BID) | ORAL | 0 refills | Status: DC

## 2019-09-16 NOTE — Progress Notes (Signed)
, °

## 2020-01-06 ENCOUNTER — Encounter: Payer: Self-pay | Admitting: Family Medicine

## 2020-01-11 ENCOUNTER — Ambulatory Visit: Payer: 59 | Admitting: Family Medicine

## 2020-01-11 ENCOUNTER — Other Ambulatory Visit: Payer: Self-pay

## 2020-01-11 ENCOUNTER — Encounter: Payer: Self-pay | Admitting: Family Medicine

## 2020-01-11 VITALS — BP 150/96 | HR 94 | Temp 97.5°F | Ht 70.75 in | Wt 229.0 lb

## 2020-01-11 DIAGNOSIS — F32A Depression, unspecified: Secondary | ICD-10-CM

## 2020-01-11 DIAGNOSIS — Z6832 Body mass index (BMI) 32.0-32.9, adult: Secondary | ICD-10-CM | POA: Diagnosis not present

## 2020-01-11 DIAGNOSIS — F329 Major depressive disorder, single episode, unspecified: Secondary | ICD-10-CM | POA: Diagnosis not present

## 2020-01-11 DIAGNOSIS — F419 Anxiety disorder, unspecified: Secondary | ICD-10-CM | POA: Diagnosis not present

## 2020-01-11 DIAGNOSIS — E6609 Other obesity due to excess calories: Secondary | ICD-10-CM

## 2020-01-11 MED ORDER — SERTRALINE HCL 50 MG PO TABS: 50.0000 mg | ORAL_TABLET | Freq: Every day | ORAL | 1 refills | Status: DC

## 2020-01-11 NOTE — Patient Instructions (Signed)
Take 1/2 tablet sertraline for 3-5 days then increase to whole tablet  Follow up in 8-12 weeks, can have virtual visit  Medication for depression and anxiety often takes 6-8 weeks to have a noticeable difference so stick with it. Also the best way for recovery is taking medication and seeing a therapist -- this is so important.      How to help anxiety and depression   1) Regular Exercise - walking, jogging, cycling, dancing, strength training - aiming for 150 minutes of exercise a week -- Yoga has been shown in research to reduce depression and anxiety -- with even just one hour long session per week -- Walk leisurely for 30 minutes every day  2)  Begin a Mindfulness/Meditation practice -- this can take a little as 3 minutes and is helpful for all kinds of mood issues -- You can find resources in books -- Or you can download apps like  -- Headspace App  -- Calm  -- Insignt Timer -- Stop, Breathe & Think   # With each of these Apps - you should decline the "start free trial" offer and as you search through the App should be able to access some of their free content. You can also chose to pay for the content if you find one that works well for you.    # Many of them also offer sleep specific content which may help with insomnia   3) Healthy Diet - Avoid fast foods and processed foods, eat mostly lean proteins, vegetables, fruits and whole grains -- Avoid or decrease Caffeine -- Avoid or decrease Alcohol -- Drink plenty of water, have a balanced diet -- Avoid cigarettes and marijuana (as well as other recreational drugs)   4) Consider contacting a professional therapist  [facility] [phone]

## 2020-01-11 NOTE — Progress Notes (Signed)
Subjective:    Patient ID: Nathan Bishop, male    DOB: 1996-04-11, 24 y.o.   MRN: [id number]  HPI Chief Complaint  Patient presents with  . Medication Management    anxiety/depression meds. Took Zoloft in the past and wants to restart this. Pt states that this seemed to work really well for him in the past. He is experiencing increased stressful times.   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  He has not been giving too many music lessons as he does niche instruments which are not as popular.  Living by himself in an apartment currently. He and his girlfriend have bought a house in [place] and will be moving in July once his lease is finished.  They were unable to find anything affordable in [place] or [place] so ended up buying in [place].  Relationship going well.  Financial stressors. Work is stressful, working a lot of hours, decreased time for sleep. Playing some music. Hiking. Previously on sertraline, thinks 50 mg. No side effects.  He has seen a therapist in the past and plans to get in touch with him to see if they have resumed in person visits as he did not like virtual visits.  Gout-no further problems.  Avoids triggers which for him are beef and pork.  Obesity-he has lost about 20 pounds since the beginning of the year.  He has been decreasing his sugary drink intake, eating fewer meals and hiking more.  His parents do intermittent fasting and keto and he is doing some intermittent fasting as well. Review of Systems     Objective:   Physical Exam Vitals reviewed.  Constitutional:      Appearance: Normal appearance. He is obese.  HENT:     Head: Normocephalic and atraumatic.  Eyes:     Conjunctiva/sclera: Conjunctivae normal.  Cardiovascular:     Rate and Rhythm: Normal rate.  Pulmonary:     Effort: Pulmonary effort is normal.  Neurological:     Mental Status: He is alert and oriented to person, place, and time.   Psychiatric:        Mood and Affect: Mood normal.        Behavior: Behavior normal.        Thought Content: Thought content normal.        Judgment: Judgment normal.       BP (!) 150/96 (BP Location: Left Arm, Patient Position: Sitting, Cuff Size: Normal)   Pulse 94   Temp (!) 97.5 F (36.4 C) (Temporal)   Ht 5' 10.75" (1.797 m)   Wt 229 lb (103.9 kg)   SpO2 99%   BMI 32.17 kg/m  Wt Readings from Last 3 Encounters:  01/11/20 229 lb (103.9 kg)  09/12/19 248 lb (112.5 kg)  10/22/18 235 lb 6.4 oz (106.8 kg)   Depression screen [facility] 2/9 01/11/2020 09/12/2019 10/22/2018 08/04/2018 02/05/2018  Decreased Interest 2 0 0 2 2  Down, Depressed, Hopeless 2 0 1 3 2   PHQ - 2 Score 4 0 1 5 4   Altered sleeping 1 - 1 2 2   Tired, decreased energy 2 - 1 3 2   Change in appetite 0 - 1 2 1   Feeling bad or failure about yourself  3 - 1 3 2   Trouble concentrating 2 - 1 1 1   Moving slowly or fidgety/restless 2 - 1 0 1  Suicidal thoughts 0 - 0 1 1  PHQ-9 Score 14 -  7 17 14   Difficult doing work/chores Very difficult - Somewhat difficult Very difficult -   GAD 7 : Generalized Anxiety Score 01/11/2020 10/22/2018 08/04/2018  Nervous, Anxious, on Edge 2 1 2   Control/stop worrying 3 0 2  Worry too much - different things 3 1 3   Trouble relaxing 2 0 1  Restless 2 0 1  Easily annoyed or irritable 0 0 2  Afraid - awful might happen 2 1 2   Total GAD 7 Score 14 3 13   Anxiety Difficulty Very difficult Somewhat difficult -         Assessment & Plan:  1. Anxiety and depression -We will restart sertraline as he did well on this previously.  Discussed expectations of medication.  Was wondered if he had any increase in anxiety or depression to stop medication immediately and seek medical assistance. -Encouraged him to resume counseling and provided him written information with nonpharmacologic ways to deal with anxiety and depression (see after visit summary). - sertraline (ZOLOFT) 50 MG tablet; Take 1  tablet (50 mg total) by mouth at bedtime.  Dispense: 90 tablet; Refill: 1 -Follow-up in 6 to 8 weeks, can do virtual visit at that time.  Sooner if any change in symptoms or worsening  2. Class 1 obesity due to excess calories without serious comorbidity with body mass index (BMI) of 32.0 to 32.9 in adult -Encouraged his efforts with improved diet, exercise and subsequent weight loss  This visit occurred during the SARS-CoV-2 public health emergency.  Safety protocols were in place, including screening questions prior to the visit, additional usage of staff PPE, and extensive cleaning of exam room while observing appropriate contact time as indicated for disinfecting solutions.    14/06/2018, FNP-BC   [facility] at [facility]  01/11/2020 1:56 PM

## 2020-02-17 ENCOUNTER — Other Ambulatory Visit: Payer: Self-pay

## 2020-02-17 ENCOUNTER — Ambulatory Visit: Payer: 59 | Admitting: Family Medicine

## 2020-02-17 ENCOUNTER — Encounter: Payer: Self-pay | Admitting: Family Medicine

## 2020-02-17 VITALS — BP 138/76 | HR 106 | Temp 97.3°F | Ht 70.75 in | Wt 217.0 lb

## 2020-02-17 DIAGNOSIS — R7303 Prediabetes: Secondary | ICD-10-CM

## 2020-02-17 DIAGNOSIS — R631 Polydipsia: Secondary | ICD-10-CM

## 2020-02-17 LAB — POCT GLYCOSYLATED HEMOGLOBIN (HGB A1C): Hemoglobin A1C: 5.7 % — AB (ref 4.0–5.6)

## 2020-02-17 NOTE — Progress Notes (Signed)
   Subjective:    Patient ID: Nathan Bishop, male    DOB: 1996-08-05, 24 y.o.   MRN: [id number]  HPI Chief Complaint  Patient presents with  . Polydipsia    x 5 days ago - started having excessive thirst, increased urination. Pt tried some intermittent fasting - felt some better with that. Pt has experienced some fatigue. Pt does not have a way of checking BS   This is a 24 yo male who presents today with above symptoms. Energy has been good. Has been doing some fasting. Eating more vegetables, taking magnesium. Feeling good.  No history of thyroid problems. No change in bowel movements. Had some constipation but has resolved. No changes in skin or hair loss. Has drastically changed his diet and started exercising. Has lost 30 pounds in 6 months.    Review of Systems Per HPI    Objective:   Physical Exam Physical Exam  Constitutional: Oriented to person, place, and time. He appears well-developed and well-nourished.  HENT:  Head: Normocephalic and atraumatic.  Eyes: Conjunctivae are normal.  Neck: Normal range of motion. Neck supple.  Cardiovascular: Normal rate, regular rhythm and normal heart sounds.   Pulmonary/Chest: Effort normal and breath sounds normal.  Musculoskeletal: Normal range of motion. No LE edema.  Neurological: Alert and oriented to person, place, and time.  Skin: Skin is warm and dry.  Psychiatric: Normal mood and affect. Behavior is normal. Judgment and thought content normal.  Vitals reviewed.     BP 138/76 (BP Location: Right Arm, Patient Position: Sitting, Cuff Size: Normal)   Pulse (!) 106   Temp (!) 97.3 F (36.3 C) (Temporal)   Ht 5' 10.75" (1.797 m)   Wt 217 lb (98.4 kg)   SpO2 97%   BMI 30.48 kg/m  Wt Readings from Last 3 Encounters:  02/17/20 217 lb (98.4 kg)  01/11/20 229 lb (103.9 kg)  09/12/19 248 lb (112.5 kg)   Results for orders placed or performed in visit on 02/17/20  HgB A1c  Result Value Ref Range   Hemoglobin A1C 5.7 (A) 4.0 -  5.6 %   HbA1c POC (<> result, manual entry)     HbA1c, POC (prediabetic range)     HbA1c, POC (controlled diabetic range)         Assessment & Plan:  1. Polydipsia - resolved, suspect related to increased fluid intake, fasting - HgB A1c  2. Prediabetes -Discussed results with patient and that he is in the very lower limit of prediabetes.  He is very motivated to continue to watch his diet and exercise regularly.  We will follow up his labs in about 6 months.  This visit occurred during the SARS-CoV-2 public health emergency.  Safety protocols were in place, including screening questions prior to the visit, additional usage of staff PPE, and extensive cleaning of exam room while observing appropriate contact time as indicated for disinfecting solutions.      Olean Ree, FNP-BC  [place] [facility] at [facility], [facility]  02/18/2020 10:21 AM

## 2020-02-17 NOTE — Patient Instructions (Addendum)
Your blood sugar reading was 5.7- this is the lower limit of prediabetes. Your BMI is 30, continue to work on your diet and weight loss and exercise- you are doing great!  We will recheck in 6 months

## 2020-03-14 ENCOUNTER — Telehealth (INDEPENDENT_AMBULATORY_CARE_PROVIDER_SITE_OTHER): Payer: 59 | Admitting: Family Medicine

## 2020-03-14 ENCOUNTER — Encounter: Payer: Self-pay | Admitting: Family Medicine

## 2020-03-14 VITALS — Ht 70.75 in | Wt 213.6 lb

## 2020-03-14 DIAGNOSIS — F419 Anxiety disorder, unspecified: Secondary | ICD-10-CM

## 2020-03-14 DIAGNOSIS — F329 Major depressive disorder, single episode, unspecified: Secondary | ICD-10-CM | POA: Diagnosis not present

## 2020-03-14 DIAGNOSIS — R7303 Prediabetes: Secondary | ICD-10-CM

## 2020-03-14 DIAGNOSIS — Z6830 Body mass index (BMI) 30.0-30.9, adult: Secondary | ICD-10-CM

## 2020-03-14 DIAGNOSIS — E6609 Other obesity due to excess calories: Secondary | ICD-10-CM

## 2020-03-14 NOTE — Progress Notes (Signed)
Virtual Visit via Video Note  I connected with Nathan Bishop on 03/14/20 at  9:00 AM EDT by a video enabled telemedicine application and verified that I am speaking with the correct person using two identifiers.  Location: Patient: In his home Provider: [facility] Persons participating in video visit: Patient and provider   I discussed the limitations of evaluation and management by telemedicine and the availability of in person appointments. The patient expressed understanding and agreed to proceed.  History of Present Illness: Chief Complaint  Patient presents with  . Follow-up    Tolerating Zoloft well - no questions/concerns. Pt has not started counseling yet   Patient reports that he has been doing well on sertraline 50 mg.  He has not had any side effects.  He is no longer working third shift which he thinks has helped with his mood overall and especially his sleep.  His only minor complaint is some intermittent fatigue.  He is not sure if this is related to his diet and continued intermittent fasting.  He has an active job and he has been trying to work out more.  Has not started counseling.  Obesity/prediabetes-he has continued to lose weight, he lost 4 pounds in the last month.   Observations/Objective: Patient is alert and answers questions appropriately.  Visible skin is unremarkable.  Respirations are even and unlabored without audible wheeze or witnessed cough.  Mood and affect are appropriate. Ht 5' 10.75" (1.797 m)   Wt 213 lb 9.6 oz (96.9 kg)   BMI 30.00 kg/m  Wt Readings from Last 3 Encounters:  03/14/20 213 lb 9.6 oz (96.9 kg)  02/17/20 217 lb (98.4 kg)  01/11/20 229 lb (103.9 kg)   Depression screen [place] 2/9 03/14/2020 01/11/2020 09/12/2019 10/22/2018 08/04/2018  Decreased Interest 0 2 0 0 2  Down, Depressed, Hopeless 0 2 0 1 3  PHQ - 2 Score 0 4 0 1 5  Altered sleeping 0 1 - 1 2  Tired, decreased energy 1 2 - 1 3  Change in appetite 0 0 - 1 2  Feeling bad  or failure about yourself  0 3 - 1 3  Trouble concentrating 0 2 - 1 1  Moving slowly or fidgety/restless 0 2 - 1 0  Suicidal thoughts 0 0 - 0 1  PHQ-9 Score 1 14 - 7 17  Difficult doing work/chores Not difficult at all Very difficult - Somewhat difficult Very difficult    Assessment and Plan: 1. Anxiety and depression -Significant improvement.  Continue sertraline 50 mg. -He has follow-up on file 07/2020  2. Prediabetes -Diagnosed at last visit with hemoglobin A1c of 5.7.  He continues to limit carbohydrates and is doing intermittent fasting with weight loss. -Will recheck at follow-up  3. Class 1 obesity due to excess calories without serious comorbidity with body mass index (BMI) of 30.0 to 30.9 in adult -Per #2   Olean Ree, FNP-BC  [facility], [facility]  03/14/2020 9:14 AM   Follow Up Instructions:    I discussed the assessment and treatment plan with the patient. The patient was provided an opportunity to ask questions and all were answered. The patient agreed with the plan and demonstrated an understanding of the instructions.   The patient was advised to call back or seek an in-person evaluation if the symptoms worsen or if the condition fails to improve as anticipated.   Emi Belfast, FNP

## 2020-03-24 ENCOUNTER — Encounter: Payer: Self-pay | Admitting: Family Medicine

## 2020-08-10 ENCOUNTER — Encounter: Payer: 59 | Admitting: Family Medicine

## 2020-08-10 ENCOUNTER — Ambulatory Visit: Payer: 59 | Admitting: Family Medicine

## 2020-10-17 IMAGING — DX DG TOE GREAT 2+V*R*
3 series · 3 of 3 positions shown · non-contrast
Comparison: 05/25/2009

CLINICAL DATA: Pain and swelling

EXAM:
RIGHT GREAT TOE

[toe ap]
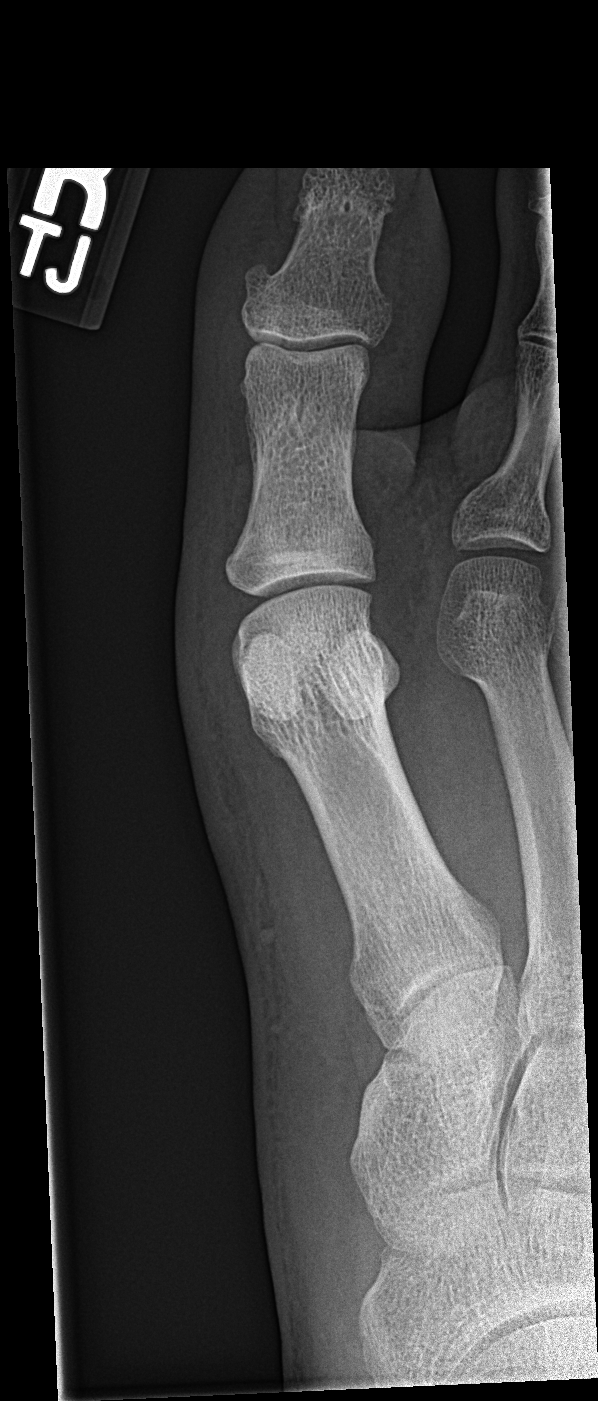

[toe obl]
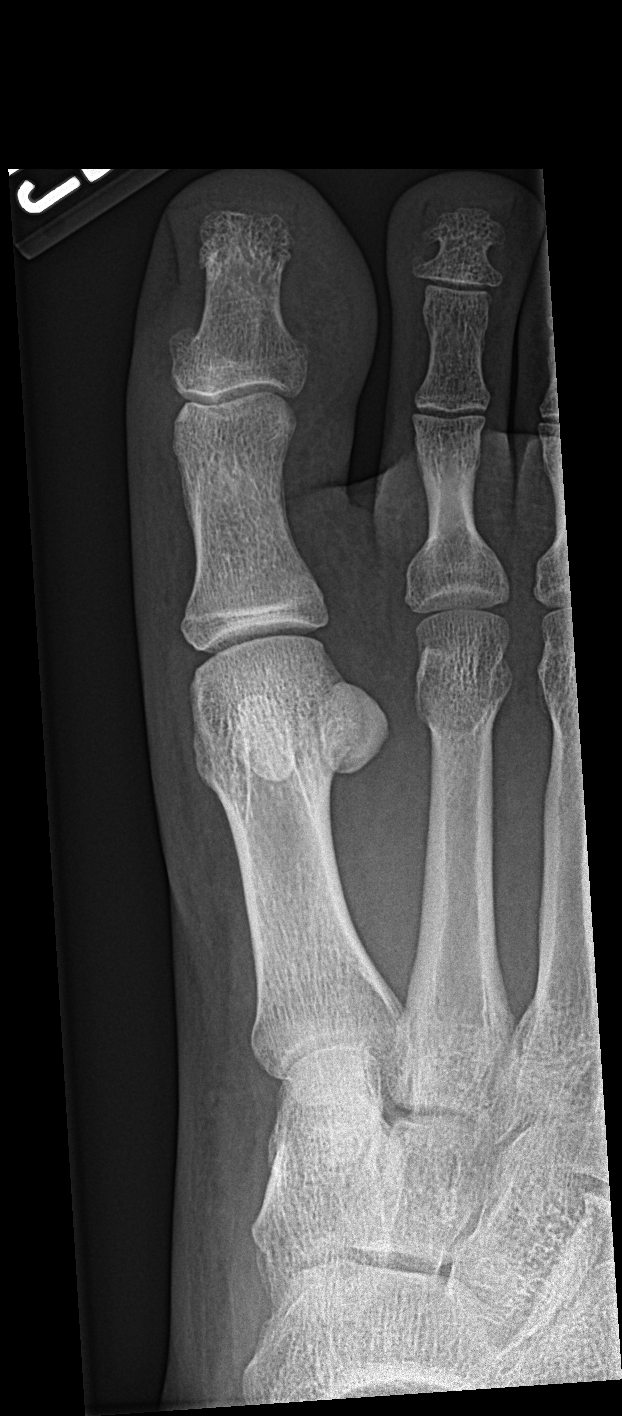

[toe lat]
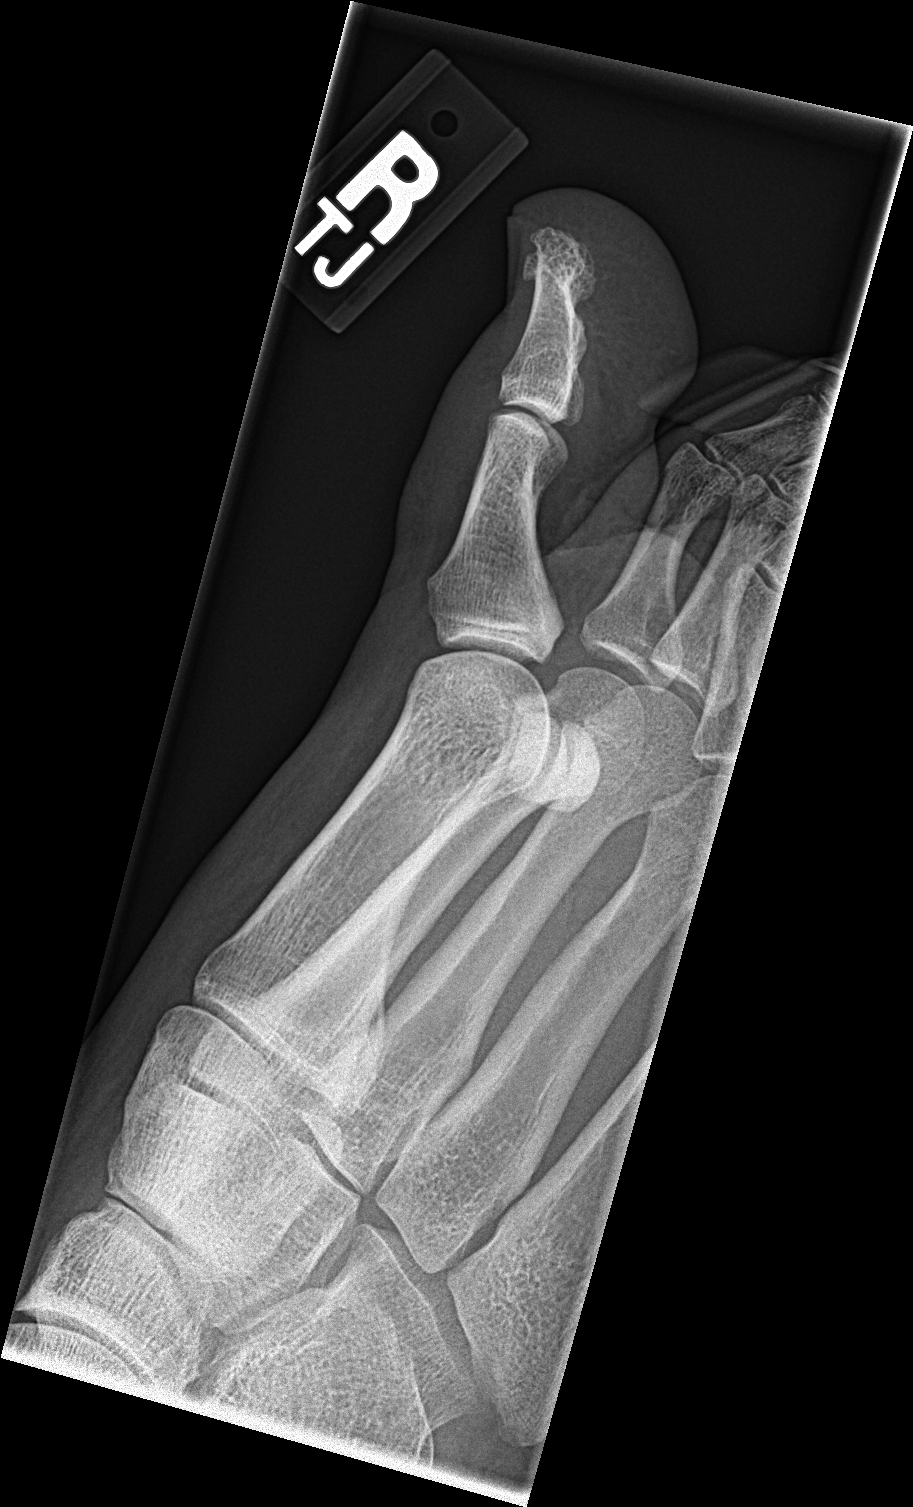

[3 of 3 positions shown; findings below may reference images not displayed]

FINDINGS: Normal alignment without acute osseous finding or displaced
fracture. No joint abnormality. Mild soft tissue swelling noted. No
radiopaque foreign body.
IMPRESSION: Soft tissue swelling without acute osseous finding.

## 2022-01-03 ENCOUNTER — Ambulatory Visit: Payer: 59 | Admitting: Family

## 2022-01-03 ENCOUNTER — Encounter: Payer: Self-pay | Admitting: Family

## 2022-01-03 VITALS — BP 132/90 | HR 82 | Temp 98.9°F | Resp 16 | Ht 70.75 in | Wt 213.2 lb

## 2022-01-03 DIAGNOSIS — T782XXA Anaphylactic shock, unspecified, initial encounter: Secondary | ICD-10-CM | POA: Diagnosis not present

## 2022-01-03 DIAGNOSIS — L719 Rosacea, unspecified: Secondary | ICD-10-CM | POA: Diagnosis not present

## 2022-01-03 DIAGNOSIS — Z2821 Immunization not carried out because of patient refusal: Secondary | ICD-10-CM

## 2022-01-03 DIAGNOSIS — G479 Sleep disorder, unspecified: Secondary | ICD-10-CM

## 2022-01-03 DIAGNOSIS — E559 Vitamin D deficiency, unspecified: Secondary | ICD-10-CM | POA: Diagnosis not present

## 2022-01-03 DIAGNOSIS — Z91014 Allergy to mammalian meats: Secondary | ICD-10-CM

## 2022-01-03 DIAGNOSIS — E790 Hyperuricemia without signs of inflammatory arthritis and tophaceous disease: Secondary | ICD-10-CM | POA: Diagnosis not present

## 2022-01-03 DIAGNOSIS — E782 Mixed hyperlipidemia: Secondary | ICD-10-CM | POA: Diagnosis not present

## 2022-01-03 DIAGNOSIS — F411 Generalized anxiety disorder: Secondary | ICD-10-CM

## 2022-01-03 DIAGNOSIS — F422 Mixed obsessional thoughts and acts: Secondary | ICD-10-CM

## 2022-01-03 DIAGNOSIS — Z0001 Encounter for general adult medical examination with abnormal findings: Secondary | ICD-10-CM | POA: Insufficient documentation

## 2022-01-03 HISTORY — DX: Generalized anxiety disorder: F41.1

## 2022-01-03 LAB — CBC WITH DIFFERENTIAL/PLATELET
Basophils Absolute: 0.1 10*3/uL (ref 0.0–0.1)
Basophils Relative: 0.8 % (ref 0.0–3.0)
Eosinophils Absolute: 0.1 10*3/uL (ref 0.0–0.7)
Eosinophils Relative: 0.9 % (ref 0.0–5.0)
HCT: 44.4 % (ref 39.0–52.0)
Hemoglobin: 14.9 g/dL (ref 13.0–17.0)
Lymphocytes Relative: 26.3 % (ref 12.0–46.0)
Lymphs Abs: 1.9 10*3/uL (ref 0.7–4.0)
MCHC: 33.5 g/dL (ref 30.0–36.0)
MCV: 86.4 fl (ref 78.0–100.0)
Monocytes Absolute: 0.5 10*3/uL (ref 0.1–1.0)
Monocytes Relative: 6.1 % (ref 3.0–12.0)
Neutro Abs: 4.9 10*3/uL (ref 1.4–7.7)
Neutrophils Relative %: 65.9 % (ref 43.0–77.0)
Platelets: 283 10*3/uL (ref 150.0–400.0)
RBC: 5.14 Mil/uL (ref 4.22–5.81)
RDW: 12.7 % (ref 11.5–15.5)
WBC: 7.4 10*3/uL (ref 4.0–10.5)

## 2022-01-03 LAB — COMPREHENSIVE METABOLIC PANEL
ALT: 21 U/L (ref 0–53)
AST: 19 U/L (ref 0–37)
Albumin: 5 g/dL (ref 3.5–5.2)
Alkaline Phosphatase: 33 U/L — ABNORMAL LOW (ref 39–117)
BUN: 13 mg/dL (ref 6–23)
CO2: 25 mEq/L (ref 19–32)
Calcium: 10.1 mg/dL (ref 8.4–10.5)
Chloride: 103 mEq/L (ref 96–112)
Creatinine, Ser: 0.98 mg/dL (ref 0.40–1.50)
GFR: 107.65 mL/min (ref >60.00)
Glucose, Bld: 92 mg/dL (ref 70–99)
Potassium: 4.4 mEq/L (ref 3.5–5.1)
Sodium: 139 mEq/L (ref 135–145)
Total Bilirubin: 0.5 mg/dL (ref 0.2–1.2)
Total Protein: 8.3 g/dL (ref 6.0–8.3)

## 2022-01-03 LAB — LIPID PANEL
Cholesterol: 273 mg/dL — ABNORMAL HIGH (ref 0–200)
HDL: 52 mg/dL (ref >39.00)
LDL Cholesterol: 187 mg/dL — ABNORMAL HIGH (ref 0–99)
NonHDL: 220.94
Total CHOL/HDL Ratio: 5
Triglycerides: 168 mg/dL — ABNORMAL HIGH (ref 0.0–149.0)
VLDL: 33.6 mg/dL (ref 0.0–40.0)

## 2022-01-03 LAB — URIC ACID: Uric Acid, Serum: 9.7 mg/dL — ABNORMAL HIGH (ref 4.0–7.8)

## 2022-01-03 LAB — VITAMIN D 25 HYDROXY (VIT D DEFICIENCY, FRACTURES): VITD: 25.32 ng/mL — ABNORMAL LOW (ref 30.00–100.00)

## 2022-01-03 MED ORDER — HYDROXYZINE PAMOATE 50 MG PO CAPS: 50.0000 mg | ORAL_CAPSULE | Freq: Three times a day (TID) | ORAL | 0 refills | Status: DC | PRN

## 2022-01-03 MED ORDER — EPINEPHRINE 0.3 MG/0.3ML IJ SOAJ: 0.3000 mg | INTRAMUSCULAR | 0 refills | Status: AC | PRN

## 2022-01-03 MED ORDER — SERTRALINE HCL 50 MG PO TABS: 50.0000 mg | ORAL_TABLET | Freq: Every day | ORAL | 3 refills | Status: DC

## 2022-01-03 MED ORDER — METRONIDAZOLE 0.75 % EX LOTN: 1.0000 "application " | TOPICAL_LOTION | Freq: Every day | CUTANEOUS | 1 refills | Status: AC | PRN

## 2022-01-03 NOTE — Progress Notes (Signed)
? ?Established Patient Office Visit ? ?Subjective:  ?Patient ID: Nathan Bishop, male    DOB: [birth year]  Age: 26 y.o. MRN: [id number] ? ?CC:  ?Chief Complaint  ?Patient presents with  ? Transitions Of Care  ? Anxiety  ? ? ?HPI ?Nathan Bishop is here for a transition of care visit. ? ?Prior provider was: Tor Netters, FNP , last seen 2021 ?Pt is without acute concerns.  ? ?Does note, eating meat leads into gout flare ups and very sensitive to red meat specifically. He tries to avoid.  ? ?Anxiety/OCD: about two months ago had too much CBD on accident, had a bad reaction, blacked out and started screaming (room mate was with him and helped calm him down) since has had intrusive thoughts about being murdered, and or the thought of trying to scream without anybody coming to help him. Has had a lot of overwhelming stress and feelings of being overwhelmed, was having chest pain from anxiety and felt it was from anxiety, couldn't sleep bc he couldn't turn off his mind. He saw a counselor in the past who suggested OCD.  ? ?For ex: when driving can not have volume with #'s of five or else he fears that he will get in an accident.  ? ?  01/03/2022  ?  9:43 AM 01/11/2020  ?  8:35 AM 10/22/2018  ? 11:59 AM 08/04/2018  ? 12:23 PM  ?GAD 7 : Generalized Anxiety Score  ?Nervous, Anxious, on Edge 3 2 1 2   ?Control/stop worrying 3 3 0 2  ?Worry too much - different things 3 3 1 3   ?Trouble relaxing 2 2 0 1  ?Restless 2 2 0 1  ?Easily annoyed or irritable 1 0 0 2  ?Afraid - awful might happen 3 2 1 2   ?Total GAD 7 Score 17 14 3 13   ?Anxiety Difficulty Somewhat difficult Very difficult Somewhat difficult   ? ? ?  01/03/2022  ?  9:44 AM 03/14/2020  ?  8:31 AM 01/11/2020  ?  8:32 AM  ?PHQ9 SCORE ONLY  ?PHQ-9 Total Score 8 1 14   ? ? ? ? ? ?HPV gardisil vaccine: not sure if he has had this. Pt will think if he wants this in the future.  ? ? ?chronic concerns: ? ? ?Past Medical History:  ?Diagnosis Date  ? Anxiety state 01/03/2022  ? Frequent  headaches   ? Hyperlipidemia   ? ? ?Past Surgical History:  ?Procedure Laterality Date  ? NO PAST SURGERIES    ? ? ?Family History  ?Problem Relation Age of Onset  ? Arthritis Mother   ? Breast cancer Mother   ?     31's  ? Hyperlipidemia Mother   ? Miscarriages / [place] Mother   ? Arthritis Father   ? Heart disease Father   ? Hyperlipidemia Father   ? Asthma Brother   ? Arthritis Maternal Grandmother   ? Arthritis Maternal Grandfather   ? Hypertension Maternal Grandfather   ? Heart disease Maternal Grandfather   ? Hyperlipidemia Maternal Grandfather   ? Hypertension Paternal Grandfather   ? Heart attack Paternal Grandfather   ?     74s  ? ? ?Social History  ? ?Socioeconomic History  ? Marital status: Single  ?  Spouse name: Not on file  ? Number of children: 0  ? Years of education: Not on file  ? Highest education level: Not on file  ?Occupational History  ? Occupation: Water engineer  ?  Comment: [employer]  ?Tobacco Use  ? Smoking status: Never  ? Smokeless tobacco: Never  ?Vaping Use  ? Vaping Use: Never used  ?Substance and Sexual Activity  ? Alcohol use: Yes  ?  Alcohol/week: 5.0 standard drinks  ?  Types: 5 Shots of liquor per week  ?  Comment: 3-5 drinks a week  ? Drug use: Never  ? Sexual activity: Yes  ?  Partners: Female  ?  Birth control/protection: None  ?Other Topics Concern  ? Not on file  ?Social History Narrative  ? Not on file  ? ?Social Determinants of Health  ? ?Financial Resource Strain: Not on file  ?Food Insecurity: Not on file  ?Transportation Needs: Not on file  ?Physical Activity: Not on file  ?Stress: Not on file  ?Social Connections: Not on file  ?Intimate Partner Violence: Not on file  ? ? ?Outpatient Medications Prior to Visit  ?Medication Sig Dispense Refill  ? Multiple Vitamin (MULTIVITAMIN) capsule Take 1 capsule by mouth daily.    ? Omega-3 Fatty Acids (FISH OIL) 1000 MG CAPS Take 2 capsules by mouth daily.    ? VITAMIN D, CHOLECALCIFEROL, PO Take 1 tablet by  mouth daily.    ? MAGNESIUM PO Take 1 tablet by mouth daily.    ? METRONIDAZOLE, TOPICAL, 0.75 % LOTN Apply 1 application topically daily as needed. 59 mL 1  ? sertraline (ZOLOFT) 50 MG tablet Take 1 tablet (50 mg total) by mouth at bedtime. (Patient not taking: Reported on 01/03/2022) 90 tablet 1  ? ?No facility-administered medications prior to visit.  ? ? ?Allergies  ?Allergen Reactions  ? Avocado Shortness Of Breath and Swelling  ? Banana Anaphylaxis, Shortness Of Breath and Swelling  ? Latex Itching  ? ? ?ROS ?Review of Systems  ?Constitutional:  Negative for activity change, appetite change, chills, fatigue, fever and unexpected weight change.  ?HENT:  Negative for postnasal drip.   ?Respiratory:  Negative for cough, shortness of breath and wheezing.   ?Cardiovascular:  Negative for chest pain, palpitations and leg swelling.  ?Gastrointestinal:  Negative for abdominal pain, constipation and diarrhea.  ?Genitourinary:  Negative for decreased urine volume and difficulty urinating.  ?Musculoskeletal:  Negative for arthralgias.  ?Skin:  Positive for rash (red rash on cheeks intermittently).  ?Neurological:  Negative for dizziness.  ?Psychiatric/Behavioral:  Positive for sleep disturbance and suicidal ideas (denies plan). Negative for agitation, decreased concentration, dysphoric mood, hallucinations and self-injury. The patient is nervous/anxious (increased anxiety and worry). The patient is not hyperactive.   ? ? ? ?  ?Objective:  ?  ?Physical Exam ?Constitutional:   ?   General: He is not in acute distress. ?   Appearance: Normal appearance. He is normal weight. He is not ill-appearing, toxic-appearing or diaphoretic.  ?HENT:  ?   Head: Normocephalic.  ?   Right Ear: Tympanic membrane normal.  ?   Left Ear: Tympanic membrane normal.  ?   Nose: Nose normal.  ?Eyes:  ?   Pupils: Pupils are equal, round, and reactive to light.  ?Cardiovascular:  ?   Rate and Rhythm: Normal rate and regular rhythm.  ?Pulmonary:  ?    Effort: Pulmonary effort is normal.  ?   Breath sounds: Normal breath sounds.  ?Abdominal:  ?   General: Abdomen is flat. Bowel sounds are normal.  ?   Palpations: Abdomen is soft.  ?   Tenderness: There is no abdominal tenderness.  ?Musculoskeletal:     ?  General: Normal range of motion.  ?   Cervical back: Normal range of motion.  ?Skin: ?   General: Skin is warm.  ?   Comments: Red raised rash bil cheeks  ?Neurological:  ?   General: No focal deficit present.  ?   Mental Status: He is alert and oriented to person, place, and time.  ?   Motor: No weakness.  ?   Gait: Gait normal.  ?Psychiatric:     ?   Mood and Affect: Mood normal.     ?   Behavior: Behavior normal.     ?   Thought Content: Thought content normal.     ?   Judgment: Judgment normal.  ? ? ? ?BP 132/90   Pulse 82   Temp 98.9 ?F (37.2 ?C)   Resp 16   Ht 5' 10.75" (1.797 m)   Wt 213 lb 3 oz (96.7 kg)   SpO2 99%   BMI 29.94 kg/m?  ?Wt Readings from Last 3 Encounters:  ?01/03/22 213 lb 3 oz (96.7 kg)  ?03/14/20 213 lb 9.6 oz (96.9 kg)  ?02/17/20 217 lb (98.4 kg)  ? ? ? ?Health Maintenance Due  ?Topic Date Due  ? HPV VACCINES (1 - Male 2-dose series) Never done  ? ? ?   ?Topic Date Due  ? HPV VACCINES (1 - Male 2-dose series) Never done  ? ? ?No results found for: TSH ?Lab Results  ?Component Value Date  ? WBC 9.0 02/05/2018  ? HGB 14.9 02/05/2018  ? HCT 43.7 02/05/2018  ? MCV 85.1 02/05/2018  ? PLT 310.0 02/05/2018  ? ?Lab Results  ?Component Value Date  ? NA 140 02/05/2018  ? K 4.6 02/05/2018  ? CO2 28 02/05/2018  ? GLUCOSE 90 02/05/2018  ? BUN 14 02/05/2018  ? CREATININE 0.99 02/05/2018  ? BILITOT 0.2 02/05/2018  ? ALKPHOS 41 02/05/2018  ? AST 24 02/05/2018  ? ALT 51 02/05/2018  ? PROT 8.3 02/05/2018  ? ALBUMIN 4.9 02/05/2018  ? CALCIUM 9.9 02/05/2018  ? GFR 101.46 02/05/2018  ? ?Lab Results  ?Component Value Date  ? CHOL 266 (H) 02/05/2018  ? ?Lab Results  ?Component Value Date  ? HDL 50.20 02/05/2018  ? ?No results found for: [place] ?Lab  Results  ?Component Value Date  ? TRIG 374.0 (H) 02/05/2018  ? ?Lab Results  ?Component Value Date  ? CHOLHDL 5 02/05/2018  ? ?Lab Results  ?Component Value Date  ? HGBA1C 5.7 (A) 02/17/2020  ? ? ?  ?Assessment & Plan:

## 2022-01-03 NOTE — Assessment & Plan Note (Signed)
Referral psychiatrist, possible dx ocd in past ?Trial tx with sertraline ?

## 2022-01-03 NOTE — Assessment & Plan Note (Addendum)
Discussed in full.  ?Possible ocd dx in past, repetitive intrusive thoughts.  ?Referred psychiatry, recommend psychology ? I instructed pt to start sertraline 1/2 tablet once daily for 1 week and then increase to a full tablet once daily on week two as tolerated.  We discussed common side effects such as nausea, drowsiness and weight gain.  Also discussed rare but serious side effect of suicidal ideation.  She is instructed to discontinue medication and go directly to ED if this occurs.  Pt verbalizes understanding.  Plan is to follow up in 30 days to evaluate progress.   ? ?rx hydroxyxine 25 mg po qhs prn anxiety ? ? ?

## 2022-01-03 NOTE — Assessment & Plan Note (Signed)
Pt states he will consider this vaccination in the future.  ?

## 2022-01-03 NOTE — Assessment & Plan Note (Signed)
rx metronidazole 0.75% refill ?

## 2022-01-03 NOTE — Assessment & Plan Note (Signed)
Repeat uric acid pending results ?

## 2022-01-03 NOTE — Assessment & Plan Note (Signed)
Pt fasting. ?Ordered lipid panel, pending results. Work on low cholesterol diet and exercise as tolerated ? ?

## 2022-01-03 NOTE — Assessment & Plan Note (Signed)
hydroxyxine may help ? ?

## 2022-01-03 NOTE — Assessment & Plan Note (Signed)
Refilled epi pen to have on hand ?

## 2022-01-03 NOTE — Assessment & Plan Note (Signed)
Ordering alpha gal ?Pending results ?

## 2022-01-03 NOTE — Assessment & Plan Note (Signed)
Patient Counseling(The following topics were reviewed): ? Preventative care handout given to pt  ?Health maintenance and immunizations reviewed. Please refer to Health maintenance section. ?Pt advised on safe sex, wearing seatbelts in car, and proper nutrition ?labwork ordered today for annual ?Dental health: Discussed importance of regular tooth brushing, flossing, and dental visits. ? ? ?

## 2022-01-03 NOTE — Patient Instructions (Signed)
call [phone].  [email or web address] to look into therapy for OCD type related thoughts and anxiety  ? ?Welcome to our clinic, I am happy to have you as my new patient. I am excited to continue on this healthcare journey with you. ? ?Stop by the lab prior to leaving today. I will notify you of your results once received.  ? ?Please keep in mind ?Any my chart messages you send have p to a three business day turnaround for a response.  ?Phone calls may have up to a one day business turnaround for a  response.  ? ?If you need a medication refill I recommend you request it through the pharmacy as this is easiest for [place] rather than sending a message and or phone call.  ? ?Due to recent changes in healthcare laws, you may see results of your imaging and/or laboratory studies on MyChart before I have had a chance to review them.  I understand that in some cases there may be results that are confusing or concerning to you. Please understand that not all results are received at the same time and often I may need to interpret multiple results in order to provide you with the best plan of care or course of treatment. Therefore, I ask that you please give me 2 business days to thoroughly review all your results before contacting my office for clarification. Should we see a critical lab result, you will be contacted sooner.  ? ?It was a pleasure seeing you today! Please do not hesitate to reach out with any questions and or concerns. ? ?Regards,  ? ?Mahagony Grieb ?FNP-C ? ? ? ? ?

## 2022-01-03 NOTE — Assessment & Plan Note (Signed)
Order vitamin d pending results ?Cont supplement ?

## 2022-01-06 NOTE — Progress Notes (Signed)
Was pt fasting?  ?If so cholesterol much too high. Needs to focus on low chol diet ? ?No anemia ? ?Uric acid is high as suspected. Let me know about cholesterol if fasting, because elevated cholesterol can contribute to elevated uric acid. Could consider taking tart cherry daily , either in food amounts of a small capsule daily which may naturally help lower uric acid.

## 2022-01-07 LAB — ALPHA-GAL PANEL
Allergen, Mutton, f88: <0.1 kU/L
Allergen, Pork, f26: <0.1 kU/L
Beef: <0.1 kU/L
CLASS: 0
CLASS: 0
Class: 0
GALACTOSE-ALPHA-1,3-GALACTOSE IGE*: <0.1 kU/L (ref <0.10)

## 2022-01-07 LAB — INTERPRETATION:

## 2022-01-25 ENCOUNTER — Other Ambulatory Visit: Payer: Self-pay | Admitting: Family

## 2022-01-25 DIAGNOSIS — F411 Generalized anxiety disorder: Secondary | ICD-10-CM

## 2022-02-22 ENCOUNTER — Other Ambulatory Visit: Payer: Self-pay | Admitting: Family

## 2022-02-22 DIAGNOSIS — F411 Generalized anxiety disorder: Secondary | ICD-10-CM

## 2022-02-28 ENCOUNTER — Other Ambulatory Visit: Payer: Self-pay | Admitting: Family

## 2022-02-28 DIAGNOSIS — F411 Generalized anxiety disorder: Secondary | ICD-10-CM

## 2022-03-03 ENCOUNTER — Other Ambulatory Visit: Payer: Self-pay | Admitting: Family

## 2022-03-03 DIAGNOSIS — F411 Generalized anxiety disorder: Secondary | ICD-10-CM

## 2022-03-05 ENCOUNTER — Other Ambulatory Visit: Payer: Self-pay | Admitting: Family

## 2022-03-05 ENCOUNTER — Encounter: Payer: Self-pay | Admitting: Family

## 2022-03-05 DIAGNOSIS — F411 Generalized anxiety disorder: Secondary | ICD-10-CM

## 2022-03-14 ENCOUNTER — Encounter: Payer: Self-pay | Admitting: Family

## 2022-03-14 ENCOUNTER — Ambulatory Visit: Payer: 59 | Admitting: Family

## 2022-03-14 VITALS — BP 118/70 | HR 82 | Temp 98.2°F | Resp 16 | Ht 70.75 in | Wt 213.2 lb

## 2022-03-14 DIAGNOSIS — E782 Mixed hyperlipidemia: Secondary | ICD-10-CM

## 2022-03-14 DIAGNOSIS — F411 Generalized anxiety disorder: Secondary | ICD-10-CM | POA: Diagnosis not present

## 2022-03-14 DIAGNOSIS — E790 Hyperuricemia without signs of inflammatory arthritis and tophaceous disease: Secondary | ICD-10-CM | POA: Diagnosis not present

## 2022-03-14 DIAGNOSIS — E7800 Pure hypercholesterolemia, unspecified: Secondary | ICD-10-CM | POA: Diagnosis not present

## 2022-03-14 LAB — LIPID PANEL
Cholesterol: 279 mg/dL — ABNORMAL HIGH (ref 0–200)
HDL: 49.8 mg/dL (ref >39.00)
NonHDL: 229.06
Total CHOL/HDL Ratio: 6
Triglycerides: 256 mg/dL — ABNORMAL HIGH (ref 0.0–149.0)
VLDL: 51.2 mg/dL — ABNORMAL HIGH (ref 0.0–40.0)

## 2022-03-14 LAB — LDL CHOLESTEROL, DIRECT: Direct LDL: 200 mg/dL

## 2022-03-14 LAB — URIC ACID: Uric Acid, Serum: 9.1 mg/dL — ABNORMAL HIGH (ref 4.0–7.8)

## 2022-03-14 MED ORDER — SERTRALINE HCL 100 MG PO TABS: 100.0000 mg | ORAL_TABLET | Freq: Every day | ORAL | 3 refills | Status: DC

## 2022-03-14 NOTE — Patient Instructions (Addendum)
Stop by the lab prior to leaving today. I will notify you of your results once received.   Once daily vitamin D3 2000 IU  Increase sertraline to 100 mg once daily.   Regards,   Mort Sawyers FNP-C

## 2022-03-14 NOTE — Progress Notes (Signed)
Established Patient Office Visit  Subjective:  Patient ID: Nathan Bishop, male    DOB: 1996-05-29  Age: 26 y.o. MRN: [id number]  CC:  Chief Complaint  Patient presents with   Medication Consultation    Follow up     HPI Nathan Bishop is here today for follow up.   Pt is with acute concerns.  Rosacea was given metronidazole last visit.   Anxiety state: referred to psychology and psychiatry. Trial rx sertraline. Has felt decreased anxiety since starting, he is noticing his ocd tendancies are also improved. Has appt with psychiatry august 14th 2023. Currently on sertraline 50 mg and tolerating well.   Gout: elevated uric acid . Rare episodes, typically right big toe. Notices if he works on his diet not such as issue. Lab Results  Component Value Date   LABURIC 9.7 (H) 01/03/2022   Vitamin d def: Last vitamin D, taking 01093 every other day.  Lab Results  Component Value Date   VD25OH 25.32 (L) 01/03/2022    Hyperlipidemia: runs in his family. Taking fish oils when he remember eats a lot of fish and salmon, veggies. He rarely eats fish.  Lab Results  Component Value Date   CHOL 273 (H) 01/03/2022   HDL 52.00 01/03/2022   LDLCALC 187 (H) 01/03/2022   LDLDIRECT 174.0 02/05/2018   TRIG 168.0 (H) 01/03/2022   CHOLHDL 5 01/03/2022     Past Medical History:  Diagnosis Date   Anxiety state 01/03/2022   Frequent headaches    Hyperlipidemia     Past Surgical History:  Procedure Laterality Date   NO PAST SURGERIES      Family History  Problem Relation Age of Onset   Arthritis Mother    Breast cancer Mother        81's   Hyperlipidemia Mother    Miscarriages / [place] Mother    Arthritis Father    Heart disease Father    Hyperlipidemia Father    Asthma Brother    Arthritis Maternal Grandmother    Arthritis Maternal Grandfather    Hypertension Maternal Grandfather    Heart disease Maternal Grandfather    Hyperlipidemia Maternal Grandfather    Hypertension  Paternal Grandfather    Heart attack Paternal Grandfather        31s    Social History   Socioeconomic History   Marital status: Single    Spouse name: Not on file   Number of children: 0   Years of education: Not on file   Highest education level: Not on file  Occupational History   Occupation: Scientist, research (physical sciences) theater    Comment: [employer]  Tobacco Use   Smoking status: Never   Smokeless tobacco: Never  Vaping Use   Vaping Use: Never used  Substance and Sexual Activity   Alcohol use: Yes    Alcohol/week: 5.0 standard drinks of alcohol    Types: 5 Shots of liquor per week    Comment: 3-5 drinks a week   Drug use: Never   Sexual activity: Yes    Partners: Female    Birth control/protection: None  Other Topics Concern   Not on file  Social History Narrative   Not on file   Social Determinants of Health   Financial Resource Strain: Not on file  Food Insecurity: Not on file  Transportation Needs: Not on file  Physical Activity: Not on file  Stress: Not on file  Social Connections: Not on file  Intimate Partner Violence: Not on  file    Outpatient Medications Prior to Visit  Medication Sig Dispense Refill   EPINEPHrine 0.3 mg/0.3 mL IJ SOAJ injection Inject 0.3 mg into the muscle as needed for anaphylaxis. 1 each 0   hydrOXYzine (VISTARIL) 50 MG capsule Take 1 capsule (50 mg total) by mouth 3 (three) times daily as needed. 30 capsule 0   METRONIDAZOLE, TOPICAL, 0.75 % LOTN Apply 1 application. topically daily as needed. 59 mL 1   Misc Natural Products (TART CHERRY ADVANCED) CAPS Take by mouth.     Multiple Vitamin (MULTIVITAMIN) capsule Take 1 capsule by mouth daily.     Omega-3 Fatty Acids (FISH OIL) 1000 MG CAPS Take 2 capsules by mouth daily.     VITAMIN D, CHOLECALCIFEROL, PO Take 1 tablet by mouth daily.     sertraline (ZOLOFT) 50 MG tablet TAKE 1 TABLET BY MOUTH EVERY DAY 30 tablet 0   No facility-administered medications prior to visit.    Allergies   Allergen Reactions   Avocado Shortness Of Breath and Swelling   Banana Anaphylaxis, Shortness Of Breath and Swelling   Latex Itching         Objective:    Physical Exam Constitutional:      General: He is not in acute distress.    Appearance: Normal appearance. He is normal weight. He is not ill-appearing, toxic-appearing or diaphoretic.  Cardiovascular:     Rate and Rhythm: Normal rate and regular rhythm.  Pulmonary:     Effort: Pulmonary effort is normal.  Neurological:     General: No focal deficit present.     Mental Status: He is alert and oriented to person, place, and time.  Psychiatric:        Mood and Affect: Mood normal.        Behavior: Behavior normal.        Thought Content: Thought content normal.        Judgment: Judgment normal.      BP 118/70   Pulse 82   Temp 98.2 F (36.8 C)   Resp 16   Ht 5' 10.75" (1.797 m)   Wt 213 lb 4 oz (96.7 kg)   SpO2 98%   BMI 29.95 kg/m  Wt Readings from Last 3 Encounters:  03/14/22 213 lb 4 oz (96.7 kg)  01/03/22 213 lb 3 oz (96.7 kg)  03/14/20 213 lb 9.6 oz (96.9 kg)     Health Maintenance Due  Topic Date Due   HPV VACCINES (1 - Male 2-dose series) Never done   COVID-19 Vaccine (3 - Pfizer series) 06/07/2020       Topic Date Due   HPV VACCINES (1 - Male 2-dose series) Never done    No results found for: "TSH" Lab Results  Component Value Date   WBC 7.4 01/03/2022   HGB 14.9 01/03/2022   HCT 44.4 01/03/2022   MCV 86.4 01/03/2022   PLT 283.0 01/03/2022   Lab Results  Component Value Date   NA 139 01/03/2022   K 4.4 01/03/2022   CO2 25 01/03/2022   GLUCOSE 92 01/03/2022   BUN 13 01/03/2022   CREATININE 0.98 01/03/2022   BILITOT 0.5 01/03/2022   ALKPHOS 33 (L) 01/03/2022   AST 19 01/03/2022   ALT 21 01/03/2022   PROT 8.3 01/03/2022   ALBUMIN 5.0 01/03/2022   CALCIUM 10.1 01/03/2022   GFR 107.65 01/03/2022   Lab Results  Component Value Date   CHOL 273 (H) 01/03/2022   Lab Results  Component Value Date   HDL 52.00 01/03/2022   Lab Results  Component Value Date   LDLCALC 187 (H) 01/03/2022   Lab Results  Component Value Date   TRIG 168.0 (H) 01/03/2022   Lab Results  Component Value Date   CHOLHDL 5 01/03/2022   Lab Results  Component Value Date   HGBA1C 5.7 (A) 02/17/2020      Assessment & Plan:   Problem List Items Addressed This Visit       Other   Elevated uric acid in blood    Repeat uric acid today pending results May have to consider starting allopurinol       Mixed hyperlipidemia    Repeat FASTING lipid panel today Work on low chol diet exercise as tolerated 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    Dispense:  30 tablet    Refill:  3    Order Specific Question:   Supervising Provider    Answer:   Ermalene Searing, AMY E [[id number]]    Follow-up: Return in about 3 months (around 06/14/2022) for regular follow up .    Mort Sawyers, FNP

## 2022-03-14 NOTE — Assessment & Plan Note (Signed)
Repeat uric acid today pending results May have to consider starting allopurinol

## 2022-03-14 NOTE — Assessment & Plan Note (Signed)
Repeat FASTING lipid panel today Work on low chol diet exercise as tolerated Suspected familial  Handout given for low cholesterol diet

## 2022-03-14 NOTE — Assessment & Plan Note (Signed)
Increase sertraline to 100 mg once daily  F/u three months Work on anxiety reducing techniques  F/u with psychiatry as scheduled

## 2022-03-17 ENCOUNTER — Other Ambulatory Visit: Payer: Self-pay | Admitting: Family

## 2022-03-17 DIAGNOSIS — E790 Hyperuricemia without signs of inflammatory arthritis and tophaceous disease: Secondary | ICD-10-CM

## 2022-03-17 MED ORDER — ALLOPURINOL 100 MG PO TABS: 100.0000 mg | ORAL_TABLET | Freq: Every day | ORAL | 6 refills | Status: DC

## 2022-03-17 NOTE — Progress Notes (Signed)
Uric acid still high I am sending in allopurinol 100 mg that you will take once a day to help decrease uric acid levels.   Triglycerdies are much too high. Worse since last visit, were you fasting? Ldl is 200 this needs to be less than 100.

## 2022-03-19 ENCOUNTER — Telehealth: Payer: Self-pay | Admitting: Family

## 2022-03-19 NOTE — Telephone Encounter (Signed)
Patient called back about message below:    Vertis Kelch, [facility]  03/19/2022  3:48 PM EDT Back to Top    Left message to return call to our office.    Mort Sawyers, FNP  03/17/2022  7:41 AM EDT     Uric acid still high I am sending in allopurinol 100 mg that you will take once a day to help decrease uric acid levels.    Triglycerdies are much too high. Worse since last visit, were you fasting? Ldl is 200 this needs to be less than 100.    He is aware and has already picked up the medicine. He said he was fasting for the appointment

## 2022-04-07 ENCOUNTER — Ambulatory Visit (INDEPENDENT_AMBULATORY_CARE_PROVIDER_SITE_OTHER): Payer: 59 | Admitting: Psychiatry

## 2022-04-07 ENCOUNTER — Encounter: Payer: Self-pay | Admitting: Psychiatry

## 2022-04-07 ENCOUNTER — Other Ambulatory Visit
Admission: RE | Admit: 2022-04-07 | Discharge: 2022-04-07 | Disposition: A | Discharge status: Home / Self Care | Payer: 59 | Source: Ambulatory Visit | Attending: Psychiatry | Admitting: Psychiatry

## 2022-04-07 VITALS — BP 142/92 | HR 101 | Temp 98.7°F | Wt 218.4 lb

## 2022-04-07 DIAGNOSIS — F411 Generalized anxiety disorder: Secondary | ICD-10-CM | POA: Diagnosis not present

## 2022-04-07 DIAGNOSIS — G4700 Insomnia, unspecified: Secondary | ICD-10-CM | POA: Diagnosis not present

## 2022-04-07 LAB — TSH: TSH: 1.395 u[IU]/mL (ref 0.350–4.500)

## 2022-04-07 MED ORDER — HYDROXYZINE PAMOATE 50 MG PO CAPS: 50.0000 mg | ORAL_CAPSULE | Freq: Two times a day (BID) | ORAL | 0 refills | Status: DC | PRN

## 2022-04-07 NOTE — Progress Notes (Unsigned)
Psychiatric Initial Adult Assessment   Patient Identification: Nathan Bishop MRN:  [id number] Date of Evaluation:  04/07/2022 Referral Source: Mort Sawyers FNP Chief Complaint:   Chief Complaint  Patient presents with   Establish Care: 26 year old CM with anxiety presented to establish care.   Visit Diagnosis:    ICD-10-CM   1. GAD (generalized anxiety disorder)  F41.1 hydrOXYzine (VISTARIL) 50 MG capsule    TSH    2. Insomnia, unspecified type  G47.00 TSH      History of Present Illness:  Nathan Bishop is a 26 year old Caucasian male, employed, single, lives in [place], has a history of anxiety, hyperlipidemia, gout, was evaluated in office today.  Patient reports he has been struggling with anxiety since the past several years.  Patient reports his anxiety got worse when he was in college, [place], [place].  Patient reports at that time he tried to get psychotherapy with a counselor, does not remember the name.  This lasted for 4 to 5 months, likely in 2018.  Patient reports he struggles with worrying about things in general, always coming up with the worst case scenario, constantly anxious, on edge, was getting worse since the past several months however his primary provider started him on sertraline and the dosage was increased to 100 mg recently.  Sertraline was started in March 2023 and dosage was increased 2 weeks ago.  Patient reports he has already started noticing an improvement in his anxiety symptoms since then.  Patient does report sleep problems.  He reports he goes to bed at around 11:30 PM- 1 AM.  Patient reports few times a week he wakes up in between at around 3 AM and is unable to fall asleep for an hour.  He currently does not take a sleep medication.  Patient reports he works late at night and once he is back home he tries to wind down and go to sleep.  Patient denies any significant depressive symptoms like sadness, hopelessness or anhedonia.  Denies any  suicidality, homicidality or perceptual disturbances.  Patient denies any substance abuse problems.  Reports he used delta 8 gummies in the past and overdid it once which caused him to have some adverse side effects.  Patient reports he no longer uses it.  Denies any other concerns today.   Associated Signs/Symptoms: Depression Symptoms:  insomnia, anxiety, panic attacks, (Hypo) Manic Symptoms:   Denied Anxiety Symptoms:  Excessive Worry, Panic Symptoms, Psychotic Symptoms:   Denied PTSD Symptoms: Negative  Past Psychiatric History: Reports a history of anxiety.  Denies being under the care of a psychiatrist before.  However did receive counseling in the past in 2018 while he was in college, 4 to 8 months-this was for anxiety.  Does not remember the name of the therapist.  Denies suicide attempts.  Denies inpatient behavioral health admissions.  Previous Psychotropic Medications: Yes sertraline, hydroxyzine  Substance Abuse History in the last 12 months:  No.  Consequences of Substance Abuse: Negative  Past Medical History:  Past Medical History:  Diagnosis Date   Anxiety state 01/03/2022   Frequent headaches    Hyperlipidemia     Past Surgical History:  Procedure Laterality Date   NO PAST SURGERIES      Family Psychiatric History: As noted below.  Family History:  Family History  Problem Relation Age of Onset   Anxiety disorder Mother    Arthritis Mother    Breast cancer Mother        110's  Hyperlipidemia Mother    Miscarriages / [place] Mother    Arthritis Father    Heart disease Father    Hyperlipidemia Father    Depression Brother    Asthma Brother    Arthritis Maternal Grandfather    Hypertension Maternal Grandfather    Heart disease Maternal Grandfather    Hyperlipidemia Maternal Grandfather    Arthritis Maternal Grandmother    Hypertension Paternal Grandfather    Heart attack Paternal Grandfather        25s    Social History:   Social  History   Socioeconomic History   Marital status: Single    Spouse name: Not on file   Number of children: 0   Years of education: Not on file   Highest education level: Some college, no degree  Occupational History   Occupation: Water engineer    Comment: [employer]  Tobacco Use   Smoking status: Never   Smokeless tobacco: Never  Vaping Use   Vaping Use: Never used  Substance and Sexual Activity   Alcohol use: Yes    Alcohol/week: 3.0 standard drinks of alcohol    Types: 3 Shots of liquor per week    Comment: 3-5 drinks a week   Drug use: Not Currently   Sexual activity: Yes    Partners: Female    Birth control/protection: None  Other Topics Concern   Not on file  Social History Narrative   Not on file   Social Determinants of Health   Financial Resource Strain: Not on file  Food Insecurity: Not on file  Transportation Needs: Not on file  Physical Activity: Not on file  Stress: Not on file  Social Connections: Not on file    Additional Social History: Born in [place].  Raised by both parents , they are still married.  Patient has 1 brother who is younger.  Patient graduated high school, some college, few months away from associate degree however dropped out.  Patient currently works as a Freight forwarder at a [employer].  Currently single.  Denies having children.  Patient currently lives in [place].  Allergies:   Allergies  Allergen Reactions   Avocado Shortness Of Breath and Swelling   Banana Anaphylaxis, Shortness Of Breath and Swelling   Latex Itching    Metabolic Disorder Labs: Lab Results  Component Value Date   HGBA1C 5.7 (A) 02/17/2020   No results found for: "PROLACTIN" Lab Results  Component Value Date   CHOL 279 (H) 03/14/2022   TRIG 256.0 (H) 03/14/2022   HDL 49.80 03/14/2022   CHOLHDL 6 03/14/2022   VLDL 51.2 (H) 03/14/2022   LDLCALC 187 (H) 01/03/2022   Lab Results  Component Value Date   TSH 1.395 04/07/2022     Therapeutic Level Labs: No results found for: "LITHIUM" No results found for: "CBMZ" No results found for: "VALPROATE"  Current Medications: Current Outpatient Medications  Medication Sig Dispense Refill   allopurinol (ZYLOPRIM) 100 MG tablet Take 1 tablet (100 mg total) by mouth daily. 30 tablet 6   EPINEPHrine 0.3 mg/0.3 mL IJ SOAJ injection Inject 0.3 mg into the muscle as needed for anaphylaxis. 1 each 0   METRONIDAZOLE, TOPICAL, 0.75 % LOTN Apply 1 application. topically daily as needed. 59 mL 1   Misc Natural Products (TART CHERRY ADVANCED) CAPS Take by mouth.     Multiple Vitamin (MULTIVITAMIN) capsule Take 1 capsule by mouth daily.     Multiple Vitamin (MULTIVITAMIN) LIQD Take 5 mLs by mouth daily.  Omega-3 Fatty Acids (FISH OIL) 1000 MG CAPS Take 2 capsules by mouth daily.     sertraline (ZOLOFT) 100 MG tablet Take 1 tablet (100 mg total) by mouth daily. 30 tablet 3   hydrOXYzine (VISTARIL) 50 MG capsule Take 1 capsule (50 mg total) by mouth 2 (two) times daily as needed. 30 capsule 0   VITAMIN D, CHOLECALCIFEROL, PO Take 1 tablet by mouth daily. (Patient not taking: Reported on 04/07/2022)     No current facility-administered medications for this visit.    Musculoskeletal: Strength & Muscle Tone: within normal limits Gait & Station: normal Patient leans: N/A  Psychiatric Specialty Exam: Review of Systems  Psychiatric/Behavioral:  Positive for sleep disturbance. The patient is nervous/anxious.   All other systems reviewed and are negative.   Blood pressure (!) 142/92, pulse (!) 101, temperature 98.7 F (37.1 C), temperature source Temporal, weight 218 lb 6.4 oz (99.1 kg).Body mass index is 30.68 kg/m.  General Appearance: Casual  Eye Contact:  Fair  Speech:  Clear and Coherent  Volume:  Normal  Mood:  Anxious  Affect:  Congruent  Thought Process:  Goal Directed and Descriptions of Associations: Intact  Orientation:  Full (Time, Place, and Person)  Thought  Content:  WDL  Suicidal Thoughts:  No  Homicidal Thoughts:  No  Memory:  Immediate;   Fair Recent;   Fair Remote;   Fair  Judgement:  Intact  Insight:  Fair  Psychomotor Activity:  Normal  Concentration:  Concentration: Fair and Attention Span: Fair  Recall:  [employer] of Knowledge:Fair  Language: Fair  Akathisia:  No  Handed:  Ambidextrous  AIMS (if indicated):  not done  Assets:  Communication Skills Desire for Improvement Housing Social Support Talents/Skills Transportation  ADL's:  Intact  Cognition: WNL  Sleep:   restless   Screenings: GAD-7    Flowsheet Row Office Visit from 04/07/2022 in [facility] Office Visit from 01/03/2022 in [facility] Visit from 01/11/2020 in [facility] Visit from 10/22/2018 in [facility] Visit from 08/04/2018 in [facility]  Total GAD-7 Score 16 17 14 3 13       PHQ2-9    Flowsheet Row Office Visit from 04/07/2022 in [facility] Office Visit from 01/03/2022 in [facility] Video Visit from 03/14/2020 in [facility] Visit from 01/11/2020 in [facility] Visit from 09/12/2019 in [facility]  PHQ-2 Total Score 0 0 0 4 0  PHQ-9 Total Score -- 8 1 14  --       Assessment and Plan: Nathan Bishop is a 26 year old Caucasian male, single, lives in [place], employed, has a history of anxiety, hyperlipidemia, gout, was evaluated in the office today.  Patient is currently struggling with anxiety, although with good response to sertraline which was recently readjusted per primary care provider.  Discussed plan as noted below. The patient demonstrates the following risk factors for suicide: Chronic risk factors for suicide include: psychiatric disorder of anxiety . Acute risk factors for  suicide include: N/A. Protective factors for this patient include: positive social support, positive therapeutic relationship, coping skills, hope for the future, and life satisfaction. Considering these factors, the overall suicide risk at this point appears to be low. Patient is appropriate for outpatient follow up.   Plan  GAD-unstable Continue sertraline 100 mg p.o. daily.  Will consider readjusting the dosage in future sessions.  Patient is not interested at this time. Referral for CBT.  Provided community resources.  Insomnia-unspecified Discussed adding melatonin 3 mg p.o. nightly Discussed sleep hygiene techniques. Could also try hydroxyzine 50 mg at bedtime as needed.  Currently prescribed as hydroxyzine 50 mg 3 times a day as needed.  Will reduce the hydroxyzine to 50 mg twice a day as needed.  Provided medication education.   Will order labs-TSH.  Patient provided lab slip.  Reviewed notes per Ms.Eugenia Pancoast -dated 03/14/2022-patient was referred to psychiatrist  and was continued on Zoloft.  Zoloft 100 mg daily."  Follow-up in clinic in 4 to 6 weeks or sooner if needed.  This note was generated in part or whole with voice recognition software. Voice recognition is usually quite accurate but there are transcription errors that can and very often do occur. I apologize for any typographical errors that were not detected and corrected.    Ursula Alert, MD 8/15/20238:40 PM

## 2022-04-07 NOTE — Patient Instructions (Signed)
www.[email or web address].org  www.psychologytoday   [facility] [phone]   [facility] [phone]  [facility] - [phone]   Insomnia Insomnia is a sleep disorder that makes it difficult to fall asleep or stay asleep. Insomnia can cause fatigue, low energy, difficulty concentrating, mood swings, and poor performance at work or school. There are three different ways to classify insomnia: Difficulty falling asleep. Difficulty staying asleep. Waking up too early in the morning. Any type of insomnia can be long-term (chronic) or short-term (acute). Both are common. Short-term insomnia usually lasts for 3 months or less. Chronic insomnia occurs at least three times a week for longer than 3 months. What are the causes? Insomnia may be caused by another condition, situation, or substance, such as: Having certain mental health conditions, such as anxiety and depression. Using caffeine, alcohol, tobacco, or drugs. Having gastrointestinal conditions, such as gastroesophageal reflux disease (GERD). Having certain medical conditions. These include: Asthma. Alzheimer's disease. Stroke. Chronic pain. An overactive thyroid gland (hyperthyroidism). Other sleep disorders, such as restless legs syndrome and sleep apnea. Menopause. Sometimes, the cause of insomnia may not be known. What increases the risk? Risk factors for insomnia include: Gender. Females are affected more often than males. Age. Insomnia is more common as people get older. Stress and certain medical and mental health conditions. Lack of exercise. Having an irregular work schedule. This may include working night shifts and traveling between different time zones. What are the signs or symptoms? If you have insomnia, the main symptom is having trouble falling asleep or having trouble staying asleep. This may lead to other symptoms, such as: Feeling tired or having low energy. Feeling  nervous about going to sleep. Not feeling rested in the morning. Having trouble concentrating. Feeling irritable, anxious, or depressed. How is this diagnosed? This condition may be diagnosed based on: Your symptoms and medical history. Your health care provider may ask about: Your sleep habits. Any medical conditions you have. Your mental health. A physical exam. How is this treated? Treatment for insomnia depends on the cause. Treatment may focus on treating an underlying condition that is causing the insomnia. Treatment may also include: Medicines to help you sleep. Counseling or therapy. Lifestyle adjustments to help you sleep better. Follow these instructions at home: Eating and drinking  Limit or avoid alcohol, caffeinated beverages, and products that contain nicotine and tobacco, especially close to bedtime. These can disrupt your sleep. Do not eat a large meal or eat spicy foods right before bedtime. This can lead to digestive discomfort that can make it hard for you to sleep. Sleep habits  Keep a sleep diary to help you and your health care provider figure out what could be causing your insomnia. Write down: When you sleep. When you wake up during the night. How well you sleep and how rested you feel the next day. Any side effects of medicines you are taking. What you eat and drink. Make your bedroom a dark, comfortable place where it is easy to fall asleep. Put up shades or blackout curtains to block light from outside. Use a white noise machine to block noise. Keep the temperature cool. Limit screen use before bedtime. This includes: Not watching TV. Not using your smartphone, tablet, or computer. Stick to a routine that includes going to bed and waking up at the same times every day and night. This can help you fall asleep faster. Consider making a quiet activity, such as  reading, part of your nighttime routine. Try to avoid taking naps during the day so that you  sleep better at night. Get out of bed if you are still awake after 15 minutes of trying to sleep. Keep the lights down, but try reading or doing a quiet activity. When you feel sleepy, go back to bed. General instructions Take over-the-counter and prescription medicines only as told by your health care provider. Exercise regularly as told by your health care provider. However, avoid exercising in the hours right before bedtime. Use relaxation techniques to manage stress. Ask your health care provider to suggest some techniques that may work well for you. These may include: Breathing exercises. Routines to release muscle tension. Visualizing peaceful scenes. Make sure that you drive carefully. Do not drive if you feel very sleepy. Keep all follow-up visits. This is important. Contact a health care provider if: You are tired throughout the day. You have trouble in your daily routine due to sleepiness. You continue to have sleep problems, or your sleep problems get worse. Get help right away if: You have thoughts about hurting yourself or someone else. Get help right away if you feel like you may hurt yourself or others, or have thoughts about taking your own life. Go to your nearest emergency room or: Call 911. Call the National Suicide Prevention Lifeline at (423)283-3440 or 988. This is open 24 hours a day. Text the Crisis Text Line at 3650940284. Summary Insomnia is a sleep disorder that makes it difficult to fall asleep or stay asleep. Insomnia can be long-term (chronic) or short-term (acute). Treatment for insomnia depends on the cause. Treatment may focus on treating an underlying condition that is causing the insomnia. Keep a sleep diary to help you and your health care provider figure out what could be causing your insomnia. This information is not intended to replace advice given to you by your health care provider. Make sure you discuss any questions you have with your health care  provider. Document Revised: 07/22/2021 Document Reviewed: 07/22/2021 Elsevier Patient Education  2023 [employer].

## 2022-04-08 ENCOUNTER — Encounter: Payer: Self-pay | Admitting: Psychiatry

## 2022-04-09 ENCOUNTER — Other Ambulatory Visit: Payer: Self-pay | Admitting: Family

## 2022-04-09 DIAGNOSIS — F411 Generalized anxiety disorder: Secondary | ICD-10-CM

## 2022-05-20 ENCOUNTER — Ambulatory Visit: Payer: 59 | Admitting: Psychiatry

## 2022-05-20 ENCOUNTER — Encounter: Payer: Self-pay | Admitting: Psychiatry

## 2022-05-20 VITALS — BP 133/81 | HR 102 | Temp 97.8°F | Ht 70.0 in | Wt 224.0 lb

## 2022-05-20 DIAGNOSIS — F411 Generalized anxiety disorder: Secondary | ICD-10-CM

## 2022-05-20 DIAGNOSIS — G4700 Insomnia, unspecified: Secondary | ICD-10-CM | POA: Diagnosis not present

## 2022-05-20 NOTE — Progress Notes (Signed)
[name] Progress Note  05/20/2022 1:43 PM [name]  MRN:  [id number]  Chief Complaint:  Chief Complaint  Patient presents with   Follow-up   Medication Refill   Anxiety   Insomnia   HPI: Nathan Bishop is a 26 year old Caucasian male, employed, single, lives in [place], has a history of generalized anxiety disorder, insomnia, hyperlipidemia, gout was evaluated in office today.  Patient today reports overall anxiety symptoms as improving.  Although he continues to worry he has been coping better.  His anxiety does not seem to affect his daily functioning.  He is currently tolerating the sertraline, compliant on it.  Patient continues to struggle with sleep.  He reports he works from 3 PM to 1 AM.  Patient reports once he gets back home he is able to sleep by around 2:15 AM.  Patient however reports sleep is interrupted throughout the night since he has to urinate several times.  His wake-up time is around 9 or 10 AM.  Patient does have hydroxyzine available however has not been using it.  Was able to establish care with a therapist, Mr. Ovid Curd Blake-currently follows up with therapist on a biweekly basis.  Reports therapy sessions as beneficial.  Denies any depressive symptoms.  Denies any suicidality, homicidality or perceptual disturbances.  Patient was able to get a new job, currently works at the [employer] in [place] and reports that has also helped with his mood symptoms.  Patient denies any other concerns today.  Visit Diagnosis:    ICD-10-CM   1. GAD (generalized anxiety disorder)  F41.1     2. Insomnia, unspecified type  G47.00       Past Psychiatric History: Reviewed past psychiatric history from progress note on 04/07/2022.  Past Medical History:  Past Medical History:  Diagnosis Date   Anxiety state 01/03/2022   Frequent headaches    Hyperlipidemia     Past Surgical History:  Procedure Laterality Date   NO PAST SURGERIES      Family Psychiatric  History: Reviewed family psychiatric history from progress note on 04/07/2022.  Family History:  Family History  Problem Relation Age of Onset   Anxiety disorder Mother    Arthritis Mother    Breast cancer Mother        62's   Hyperlipidemia Mother    Miscarriages / [place] Mother    Arthritis Father    Heart disease Father    Hyperlipidemia Father    Depression Brother    Asthma Brother    Arthritis Maternal Grandfather    Hypertension Maternal Grandfather    Heart disease Maternal Grandfather    Hyperlipidemia Maternal Grandfather    Arthritis Maternal Grandmother    Hypertension Paternal Grandfather    Heart attack Paternal Grandfather        51s    Social History: Reviewed social history from progress note on 04/07/2022. Social History   Socioeconomic History   Marital status: Single    Spouse name: Not on file   Number of children: 0   Years of education: Not on file   Highest education level: Some college, no degree  Occupational History   Occupation: Water engineer    Comment: [employer]  Tobacco Use   Smoking status: Never   Smokeless tobacco: Never  Vaping Use   Vaping Use: Never used  Substance and Sexual Activity   Alcohol use: Yes    Alcohol/week: 3.0 standard drinks of alcohol    Types: 3 Shots  of liquor per week    Comment: 3-5 drinks a week   Drug use: Not Currently   Sexual activity: Yes    Partners: Female    Birth control/protection: None  Other Topics Concern   Not on file  Social History Narrative   Not on file   Social Determinants of Health   Financial Resource Strain: Not on file  Food Insecurity: Not on file  Transportation Needs: Not on file  Physical Activity: Not on file  Stress: Not on file  Social Connections: Not on file    Allergies:  Allergies  Allergen Reactions   Avocado Shortness Of Breath and Swelling   Banana Anaphylaxis, Shortness Of Breath and Swelling   Latex Itching    Metabolic  Disorder Labs: Lab Results  Component Value Date   HGBA1C 5.7 (A) 02/17/2020   No results found for: "PROLACTIN" Lab Results  Component Value Date   CHOL 279 (H) 03/14/2022   TRIG 256.0 (H) 03/14/2022   HDL 49.80 03/14/2022   CHOLHDL 6 03/14/2022   VLDL 51.2 (H) 03/14/2022   LDLCALC 187 (H) 01/03/2022   Lab Results  Component Value Date   TSH 1.395 04/07/2022    Therapeutic Level Labs: No results found for: "LITHIUM" No results found for: "VALPROATE" No results found for: "CBMZ"  Current Medications: Current Outpatient Medications  Medication Sig Dispense Refill   allopurinol (ZYLOPRIM) 100 MG tablet Take 1 tablet (100 mg total) by mouth daily. 30 tablet 6   EPINEPHrine 0.3 mg/0.3 mL IJ SOAJ injection Inject 0.3 mg into the muscle as needed for anaphylaxis. 1 each 0   hydrOXYzine (VISTARIL) 50 MG capsule Take 1 capsule (50 mg total) by mouth 2 (two) times daily as needed. 30 capsule 0   METRONIDAZOLE, TOPICAL, 0.75 % LOTN Apply 1 application. topically daily as needed. 59 mL 1   Misc Natural Products (TART CHERRY ADVANCED) CAPS Take by mouth.     Multiple Vitamin (MULTIVITAMIN) capsule Take 1 capsule by mouth daily.     Omega-3 Fatty Acids (FISH OIL) 1000 MG CAPS Take 2 capsules by mouth daily.     sertraline (ZOLOFT) 100 MG tablet TAKE 1 TABLET BY MOUTH EVERY DAY 90 tablet 3   VITAMIN D, CHOLECALCIFEROL, PO Take 1 tablet by mouth daily.     No current facility-administered medications for this visit.     Musculoskeletal: Strength & Muscle Tone: within normal limits Gait & Station: normal Patient leans: N/A  Psychiatric Specialty Exam: Review of Systems  Psychiatric/Behavioral:  Positive for sleep disturbance. The patient is nervous/anxious.   All other systems reviewed and are negative.   Blood pressure 133/81, pulse (!) 102, temperature 97.8 F (36.6 C), temperature source Temporal, height 5\' 10"  (1.778 m), weight 224 lb (101.6 kg).Body mass index is 32.14  kg/m.  General Appearance: Casual  Eye Contact:  Fair  Speech:  Clear and Coherent  Volume:  Normal  Mood:  Anxious  Affect:  Full Range  Thought Process:  Goal Directed and Descriptions of Associations: Intact  Orientation:  Full (Time, Place, and Person)  Thought Content: Logical   Suicidal Thoughts:  No  Homicidal Thoughts:  No  Memory:  Immediate;   Fair Recent;   Fair Remote;   Fair  Judgement:  Fair  Insight:  Fair  Psychomotor Activity:  Normal  Concentration:  Concentration: Fair and Attention Span: Fair  Recall:  [employer] of Knowledge: Fair  Language: Fair  Akathisia:  No  Handed:  Ambidextrous  AIMS (if indicated): not done  Assets:  Communication Skills Desire for [place] Talents/Skills Transportation  ADL's:  Intact  Cognition: WNL  Sleep:   restless   Screenings: GAD-7    Flowsheet Row Office Visit from 05/20/2022 in [place] Office Visit from 04/07/2022 in [place] Office Visit from 01/03/2022 in [place] at [facility] Visit from 01/11/2020 in [place] at [facility] Visit from 10/22/2018 in [place] at [facility]  Total GAD-7 Score 9 16 17 14 3       PHQ2-9    [facility] Visit from 05/20/2022 in [place] Visit from 04/07/2022 in [place] Visit from 01/03/2022 in [place] at [facility] Video Visit from 03/14/2020 in [place] at [facility] Visit from 01/11/2020 in [place] at [facility] Total Score 0 0 0 0 4  PHQ-9 Total Score -- -- 8 1 14       [place] Office Visit from 05/20/2022 in [place] No Risk        Assessment and Plan: Calvion Delee is a 26 year old Caucasian male, single, lives in [place], employed, has a history  of anxiety, hyperlipidemia, gout was evaluated in office today.  Patient with current improvement with regards to his anxiety although continues to struggle with sleep, will benefit from the following plan.  Plan GAD-improving Sertraline 100 mg p.o. daily Patient to continue CBT with Mr. Eugenia Pancoast on a biweekly basis   Insomnia-unspecified-unstable Continue hydroxyzine 50 mg at bedtime as needed, patient has not been using it. Discussed sleep hygiene techniques including limiting the amount of fluid at the end of the day, limiting caffeinated drinks.  Patient to work on sleep hygiene techniques.  Reviewed and discussed TSH-dated 04/07/2022-within normal limits.  Follow-up in clinic in 3 months or sooner if needed.  This note was generated in part or whole with voice recognition software. Voice recognition is usually quite accurate but there are transcription errors that can and very often do occur. I apologize for any typographical errors that were not detected and corrected.     Ursula Alert, MD 05/20/2022, 1:43 PM

## 2022-08-07 ENCOUNTER — Ambulatory Visit: Payer: 59 | Admitting: Psychiatry

## 2022-09-04 ENCOUNTER — Ambulatory Visit: Payer: 59 | Admitting: Psychiatry

## 2022-09-11 ENCOUNTER — Other Ambulatory Visit: Payer: Self-pay | Admitting: Family

## 2022-09-11 DIAGNOSIS — E790 Hyperuricemia without signs of inflammatory arthritis and tophaceous disease: Secondary | ICD-10-CM

## 2023-03-05 ENCOUNTER — Encounter: Payer: Self-pay | Admitting: Family

## 2023-03-05 ENCOUNTER — Ambulatory Visit: Payer: Managed Care, Other (non HMO) | Admitting: Family

## 2023-03-05 VITALS — BP 110/70 | HR 83 | Temp 98.0°F | Ht 70.0 in | Wt 228.4 lb

## 2023-03-05 DIAGNOSIS — E790 Hyperuricemia without signs of inflammatory arthritis and tophaceous disease: Secondary | ICD-10-CM

## 2023-03-05 DIAGNOSIS — G4700 Insomnia, unspecified: Secondary | ICD-10-CM

## 2023-03-05 DIAGNOSIS — R5383 Other fatigue: Secondary | ICD-10-CM

## 2023-03-05 DIAGNOSIS — E559 Vitamin D deficiency, unspecified: Secondary | ICD-10-CM | POA: Diagnosis not present

## 2023-03-05 DIAGNOSIS — E782 Mixed hyperlipidemia: Secondary | ICD-10-CM

## 2023-03-05 DIAGNOSIS — R4189 Other symptoms and signs involving cognitive functions and awareness: Secondary | ICD-10-CM | POA: Diagnosis not present

## 2023-03-05 DIAGNOSIS — F411 Generalized anxiety disorder: Secondary | ICD-10-CM

## 2023-03-05 LAB — LIPID PANEL
Cholesterol: 328 mg/dL — ABNORMAL HIGH (ref 0–200)
HDL: 50.3 mg/dL (ref >39.00)
NonHDL: 277.27
Total CHOL/HDL Ratio: 7
Triglycerides: 368 mg/dL — ABNORMAL HIGH (ref 0.0–149.0)
VLDL: 73.6 mg/dL — ABNORMAL HIGH (ref 0.0–40.0)

## 2023-03-05 LAB — LDL CHOLESTEROL, DIRECT: Direct LDL: 224 mg/dL

## 2023-03-05 LAB — VITAMIN B12: Vitamin B-12: 265 pg/mL (ref 211–911)

## 2023-03-05 LAB — VITAMIN D 25 HYDROXY (VIT D DEFICIENCY, FRACTURES): VITD: 19.33 ng/mL — ABNORMAL LOW (ref 30.00–100.00)

## 2023-03-05 NOTE — Assessment & Plan Note (Signed)
Did d/w pt that it is ok to take hydroxyxine for sleep prn

## 2023-03-05 NOTE — Progress Notes (Signed)
Established Patient Office Visit  Subjective:   Patient ID: Nathan Bishop, male    DOB: Nov 27, 1995  Age: 27 y.o. MRN: [id number]  CC:  Chief Complaint  Patient presents with   Behavior Problem    Scatterbrained behavior. Pt states its been going on for a long time but recently he has memory problems. Yes to Health maintance gaps     HPI: Nathan Bishop is a 27 y.o. male presenting on 03/05/2023 for Behavior Problem (Scatterbrained behavior. Pt states its been going on for a long time but recently he has memory problems. Yes to Health maintance gaps )  Often feels 'scatter brained' Hard to focus fully on different things  Recently, over the last month or so, felt it is coming to a point where at times he will just forget things completely, doesn't complete tasks. This is causing increased stress as well.  Work stressors do contribute as well.   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  Not so many issues with delegating at work but does do a lot of administration worse and struggling throughout the day whole day to get everything done. Did feel this way as a kid but never was officially diagnosed in his childhood.      03/05/2023   11:13 AM 05/20/2022    1:33 PM 04/07/2022    9:54 AM 01/03/2022    9:43 AM  GAD 7 : Generalized Anxiety Score  Nervous, Anxious, on Edge 1 2 2 3   Control/stop worrying 1 2 3 3   Worry too much - different things 2 2 3 3   Trouble relaxing 1 1 1 2   Restless 2 1 3 2   Easily annoyed or irritable 0 0 1 1  Afraid - awful might happen 1 1 3 3   Total GAD 7 Score 8 9 16 17   Anxiety Difficulty Somewhat difficult Not difficult at all Somewhat difficult Somewhat difficult       03/05/2023   11:13 AM 05/20/2022    1:14 PM 04/07/2022    9:53 AM  PHQ9 SCORE ONLY  PHQ-9 Total Score 7 0 0    Elevated uric acid: taking allopurinol 100 mg once daily, does forget at times, more like five days a week.         ROS: Negative unless specifically indicated above in HPI.    Relevant past medical history reviewed and updated as indicated.   Allergies and medications reviewed and updated.   Current Outpatient Medications:    allopurinol (ZYLOPRIM) 100 MG tablet, Take 1 tablet (100 mg total) by mouth daily., Disp: 30 tablet, Rfl: 6   EPINEPHrine 0.3 mg/0.3 mL IJ SOAJ injection, Inject 0.3 mg into the muscle as needed for anaphylaxis., Disp: 1 each, Rfl: 0   hydrOXYzine (VISTARIL) 50 MG capsule, Take 1 capsule (50 mg total) by mouth 2 (two) times daily as needed., Disp: 30 capsule, Rfl: 0   METRONIDAZOLE, TOPICAL, 0.75 % LOTN, Apply 1 application. topically daily as needed., Disp: 59 mL, Rfl: 1   Misc Natural Products (TART CHERRY ADVANCED) CAPS, Take by mouth., Disp: , Rfl:    Multiple Vitamin (MULTIVITAMIN) capsule, Take 1 capsule by mouth daily., Disp: , Rfl:    Omega-3 Fatty Acids (FISH OIL) 1000 MG CAPS, Take 2 capsules by mouth daily., Disp: , Rfl:    sertraline (ZOLOFT) 100 MG tablet, TAKE 1 TABLET BY MOUTH EVERY DAY, Disp: 90 tablet, Rfl: 3   VITAMIN D, CHOLECALCIFEROL, PO, Take 1 tablet by mouth daily., Disp: , Rfl:  Allergies  Allergen Reactions   Avocado Shortness Of Breath and Swelling   Banana Anaphylaxis, Shortness Of Breath and Swelling   Latex Itching    Objective:   BP 110/70   Pulse 83   Temp 98 F (36.7 C) (Temporal)   Ht 5\' 10"  (1.778 m)   Wt 228 lb 6.4 oz (103.6 kg)   SpO2 97%   BMI 32.77 kg/m    Physical Exam Constitutional:      General: He is not in acute distress.    Appearance: Normal appearance. He is normal weight. He is not ill-appearing, toxic-appearing or diaphoretic.  Cardiovascular:     Rate and Rhythm: Normal rate.  Pulmonary:     Effort: Pulmonary effort is normal.  Musculoskeletal:        General: Normal range of motion.  Neurological:     General: No focal deficit present.     Mental Status: He is alert and oriented to person, place, and time. Mental status is at baseline.  Psychiatric:        Mood  and Affect: Mood normal.        Behavior: Behavior normal.        Thought Content: Thought content normal.        Judgment: Judgment normal.     Assessment & Plan:  Elevated uric acid in blood Assessment & Plan: D/w pt that I need him to be more consistent with taking allopurinol daily and we will have him come back for lab only in two months for repeat.    Orders: -     Uric acid; Future  Mixed hyperlipidemia Assessment & Plan: Ordered lipid panel, pending results. Work on low cholesterol diet and exercise as tolerated   Orders: -     Lipid panel  Brain fog -     Vitamin B12  Vitamin D deficiency -     VITAMIN D 25 Hydroxy (Vit-D Deficiency, Fractures)  Other fatigue  Insomnia, unspecified type Assessment & Plan: Did d/w pt that it is ok to take hydroxyxine for sleep prn    GAD (generalized anxiety disorder) Assessment & Plan: Suspect increased anxiety vs ADD.  Advised pt he will need to see his psychiatrist to evaluate further for ADD possibility. Pt states he will make appt.  Did d/w pt to also work on anxiety reducing strategies.  Continue sertraline 100 mg once daily.  Hydroxyxine prn  Will also r/o b12 def for brain fog and fatigue.      Follow up plan: Return in about 1 year (around 03/04/2024) for f/u CPE.  Mort Sawyers, FNP

## 2023-03-05 NOTE — Addendum Note (Signed)
Addended by: Shon Millet on: 03/05/2023 11:40 AM   Modules accepted: Orders

## 2023-03-05 NOTE — Assessment & Plan Note (Signed)
D/w pt that I need him to be more consistent with taking allopurinol daily and we will have him come back for lab only in two months for repeat.

## 2023-03-05 NOTE — Assessment & Plan Note (Signed)
Suspect increased anxiety vs ADD.  Advised pt he will need to see his psychiatrist to evaluate further for ADD possibility. Pt states he will make appt.  Did d/w pt to also work on anxiety reducing strategies.  Continue sertraline 100 mg once daily.  Hydroxyxine prn  Will also r/o b12 def for brain fog and fatigue.

## 2023-03-05 NOTE — Assessment & Plan Note (Signed)
Ordered lipid panel, pending results. Work on low cholesterol diet and exercise as tolerated  

## 2023-03-06 ENCOUNTER — Other Ambulatory Visit: Payer: Self-pay | Admitting: Family

## 2023-03-06 DIAGNOSIS — E782 Mixed hyperlipidemia: Secondary | ICD-10-CM

## 2023-03-06 MED ORDER — PRAVASTATIN SODIUM 20 MG PO TABS: 20.0000 mg | ORAL_TABLET | Freq: Every day | ORAL | 3 refills | Status: DC

## 2023-03-07 ENCOUNTER — Encounter: Payer: Self-pay | Admitting: Family

## 2023-03-07 DIAGNOSIS — E790 Hyperuricemia without signs of inflammatory arthritis and tophaceous disease: Secondary | ICD-10-CM

## 2023-03-07 DIAGNOSIS — F411 Generalized anxiety disorder: Secondary | ICD-10-CM

## 2023-03-07 DIAGNOSIS — E782 Mixed hyperlipidemia: Secondary | ICD-10-CM

## 2023-03-09 MED ORDER — SERTRALINE HCL 100 MG PO TABS
100.0000 mg | ORAL_TABLET | Freq: Every day | ORAL | 3 refills | Status: DC
Start: 2023-03-09 — End: 2023-04-03

## 2023-03-09 MED ORDER — ALLOPURINOL 100 MG PO TABS
100.0000 mg | ORAL_TABLET | Freq: Every day | ORAL | 3 refills | Status: DC
Start: 2023-03-09 — End: 2024-01-07

## 2023-03-09 MED ORDER — HYDROXYZINE PAMOATE 50 MG PO CAPS
50.0000 mg | ORAL_CAPSULE | Freq: Two times a day (BID) | ORAL | 3 refills | Status: AC | PRN
Start: 2023-03-09 — End: ?

## 2023-03-09 MED ORDER — PRAVASTATIN SODIUM 20 MG PO TABS
20.0000 mg | ORAL_TABLET | Freq: Every day | ORAL | 3 refills | Status: AC
Start: 2023-03-09 — End: ?

## 2023-03-11 ENCOUNTER — Ambulatory Visit: Payer: 59 | Admitting: Family

## 2023-04-03 ENCOUNTER — Other Ambulatory Visit: Payer: Self-pay | Admitting: Family

## 2023-04-03 DIAGNOSIS — F411 Generalized anxiety disorder: Secondary | ICD-10-CM

## 2024-01-07 ENCOUNTER — Encounter: Payer: Self-pay | Admitting: Family

## 2024-01-07 DIAGNOSIS — F411 Generalized anxiety disorder: Secondary | ICD-10-CM

## 2024-01-07 DIAGNOSIS — E790 Hyperuricemia without signs of inflammatory arthritis and tophaceous disease: Secondary | ICD-10-CM

## 2024-01-10 MED ORDER — ALLOPURINOL 100 MG PO TABS
100.0000 mg | ORAL_TABLET | Freq: Every day | ORAL | 0 refills | Status: AC
Start: 2024-01-10 — End: ?

## 2024-01-10 MED ORDER — SERTRALINE HCL 100 MG PO TABS
100.0000 mg | ORAL_TABLET | Freq: Every day | ORAL | 0 refills | Status: AC
Start: 2024-01-10 — End: ?

## 2024-04-22 ENCOUNTER — Other Ambulatory Visit: Payer: Self-pay | Admitting: Family

## 2024-04-22 DIAGNOSIS — E790 Hyperuricemia without signs of inflammatory arthritis and tophaceous disease: Secondary | ICD-10-CM

## 2024-04-22 DIAGNOSIS — F411 Generalized anxiety disorder: Secondary | ICD-10-CM

## 2024-04-30 ENCOUNTER — Encounter: Payer: Self-pay | Admitting: Family
